# Patient Record
Sex: Female | Born: 1990 | Hispanic: No | Smoking: Current some day smoker
Health system: Southern US, Community
[De-identification: ages and names within clinical notes are randomized; demographics above are authoritative.]

## PROBLEM LIST (undated history)

## (undated) ENCOUNTER — Inpatient Hospital Stay (HOSPITAL_COMMUNITY): Payer: No Typology Code available for payment source

## (undated) DIAGNOSIS — D219 Benign neoplasm of connective and other soft tissue, unspecified: Secondary | ICD-10-CM

## (undated) DIAGNOSIS — A4902 Methicillin resistant Staphylococcus aureus infection, unspecified site: Secondary | ICD-10-CM

## (undated) DIAGNOSIS — F909 Attention-deficit hyperactivity disorder, unspecified type: Secondary | ICD-10-CM

## (undated) DIAGNOSIS — E282 Polycystic ovarian syndrome: Secondary | ICD-10-CM

## (undated) DIAGNOSIS — N390 Urinary tract infection, site not specified: Secondary | ICD-10-CM

## (undated) DIAGNOSIS — R51 Headache: Secondary | ICD-10-CM

## (undated) DIAGNOSIS — IMO0002 Reserved for concepts with insufficient information to code with codable children: Secondary | ICD-10-CM

## (undated) HISTORY — PX: WISDOM TOOTH EXTRACTION: SHX21

## (undated) HISTORY — PX: MYRINGOTOMY: SUR874

---
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
## 2012-01-12 ENCOUNTER — Inpatient Hospital Stay (HOSPITAL_COMMUNITY)
Admission: AD | Admit: 2012-01-12 | Discharge: 2012-01-12 | Disposition: A | Discharge status: Home / Self Care | Payer: Self-pay | Source: Ambulatory Visit | Attending: Obstetrics and Gynecology | Admitting: Obstetrics and Gynecology

## 2012-01-12 ENCOUNTER — Encounter (HOSPITAL_COMMUNITY): Payer: Self-pay | Admitting: *Deleted

## 2012-01-12 DIAGNOSIS — Z3202 Encounter for pregnancy test, result negative: Secondary | ICD-10-CM | POA: Insufficient documentation

## 2012-01-12 DIAGNOSIS — R109 Unspecified abdominal pain: Secondary | ICD-10-CM | POA: Insufficient documentation

## 2012-01-12 LAB — POCT PREGNANCY, URINE: Preg Test, Ur: NEGATIVE

## 2012-01-12 LAB — URINALYSIS, ROUTINE W REFLEX MICROSCOPIC
Leukocytes, UA: NEGATIVE
Nitrite: NEGATIVE
Specific Gravity, Urine: 1.02 (ref 1.005–1.030)
pH: 6.5 (ref 5.0–8.0)

## 2012-01-12 LAB — URINE MICROSCOPIC-ADD ON

## 2012-01-12 NOTE — MAU Note (Signed)
Pt reports she has been having headache and abd cramping and nausea in the morning for the past 2 weeks.

## 2012-01-12 NOTE — MAU Provider Note (Signed)
History     CSN: [id number]  Arrival date and time: 01/12/12 1650   First Provider Initiated Contact with Patient 01/12/12 1923      Chief Complaint  Patient presents with  . Possible Pregnancy  . Abdominal Pain   HPI Monica Ramos 21 y.o.  LMP 12-29-11.  Comes to MAU to have a pregnancy test.  Was not able to get birth control pills filled as she did not have the money.  Medicaid has expired due to her age and she has no other insurance.  Was not feeling well this morning and was worried she was pregnant.  OB History    Grav Para Term Preterm Abortions TAB SAB Ect Mult Living   0               Past Medical History  Diagnosis Date  . Asthma   . ADHD (attention deficit hyperactivity disorder)   . Headache   . Ulcer   . Urinary tract infection     Past Surgical History  Procedure Date  . Wisdom tooth extraction   . Myringotomy     Family History  Problem Relation Age of Onset  . Anesthesia problems Mother   . Asthma Father   . Heart disease Father   . Diabetes Father   . Hearing loss Father   . Heart disease Paternal Uncle   . Cancer Maternal Grandmother   . Heart disease Maternal Grandfather   . Diabetes Maternal Grandfather   . Asthma Brother     History  Substance Use Topics  . Smoking status: Never Smoker   . Smokeless tobacco: Never Used  . Alcohol Use: No    Allergies: No Known Allergies  Prescriptions prior to admission  Medication Sig Dispense Refill  . ACETAMINOPHEN-CAFFEINE PO Take 1 tablet by mouth every 6 (six) hours as needed. For migraine      . tranexamic acid (LYSTEDA) 650 MG TABS Take 1,300 mg by mouth 3 (three) times daily. Take with menstrual bleeding        Review of Systems  Gastrointestinal: Positive for abdominal pain. Negative for nausea, vomiting, diarrhea and constipation.  Genitourinary:       No vaginal discharge. No vaginal bleeding. No dysuria.  Neurological: Positive for headaches.   Physical Exam   Blood  pressure 134/77, pulse 80, temperature 98.6 F (37 C), temperature source Oral, resp. rate 18, height 5\' 6"  (1.676 m), weight 101.606 kg (224 lb), last menstrual period 12/29/2011.  Physical Exam  Nursing note and vitals reviewed. Constitutional: She is oriented to person, place, and time. She appears well-developed and well-nourished.  HENT:  Head: Normocephalic.  Eyes: EOM are normal.  Neck: Neck supple.  Musculoskeletal: Normal range of motion.  Neurological: She is alert and oriented to person, place, and time.  Skin: Skin is warm and dry.  Psychiatric: She has a normal mood and affect.    MAU Course  Procedures Results for orders placed during the hospital encounter of 01/12/12 (from the past 24 hour(s))  URINALYSIS, ROUTINE W REFLEX MICROSCOPIC     Status: Abnormal   Collection Time   01/12/12  5:19 PM      Component Value Range   Color, Urine YELLOW  YELLOW   APPearance CLEAR  CLEAR   Specific Gravity, Urine 1.020  1.005 - 1.030   pH 6.5  5.0 - 8.0   Glucose, UA NEGATIVE  NEGATIVE mg/dL   Hgb urine dipstick MODERATE (*) NEGATIVE   Bilirubin Urine  NEGATIVE  NEGATIVE   Ketones, ur NEGATIVE  NEGATIVE mg/dL   Protein, ur NEGATIVE  NEGATIVE mg/dL   Urobilinogen, UA 0.2  0.0 - 1.0 mg/dL   Nitrite NEGATIVE  NEGATIVE   Leukocytes, UA NEGATIVE  NEGATIVE  URINE MICROSCOPIC-ADD ON     Status: Abnormal   Collection Time   01/12/12  5:19 PM      Component Value Range   Squamous Epithelial / LPF FEW (*) RARE   WBC, UA 0-2  <3 WBC/hpf   RBC / HPF 3-6  <3 RBC/hpf   Bacteria, UA RARE  RARE  POCT PREGNANCY, URINE     Status: Normal   Collection Time   01/12/12  5:47 PM      Component Value Range   Preg Test, Ur NEGATIVE  NEGATIVE    MDM Discussed options for contraception.  Advised condoms always if she is having sex.  Assessment and Plan  Negative pregnancy test  Plan Condoms always for contraception. [facility] as pills much  less expensive with the visit there since your insurance has expired. Monica Ramos 01/12/2012, 7:28 PM

## 2012-05-17 ENCOUNTER — Encounter (HOSPITAL_COMMUNITY): Payer: Self-pay | Admitting: *Deleted

## 2012-05-17 ENCOUNTER — Emergency Department (HOSPITAL_COMMUNITY)
Admission: EM | Admit: 2012-05-17 | Discharge: 2012-05-17 | Disposition: A | Discharge status: Home / Self Care | Payer: Medicaid Other | Attending: Emergency Medicine | Admitting: Emergency Medicine

## 2012-05-17 DIAGNOSIS — J069 Acute upper respiratory infection, unspecified: Secondary | ICD-10-CM | POA: Insufficient documentation

## 2012-05-17 DIAGNOSIS — B349 Viral infection, unspecified: Secondary | ICD-10-CM

## 2012-05-17 DIAGNOSIS — Z3202 Encounter for pregnancy test, result negative: Secondary | ICD-10-CM | POA: Insufficient documentation

## 2012-05-17 DIAGNOSIS — B9789 Other viral agents as the cause of diseases classified elsewhere: Secondary | ICD-10-CM | POA: Insufficient documentation

## 2012-05-17 DIAGNOSIS — J45909 Unspecified asthma, uncomplicated: Secondary | ICD-10-CM | POA: Insufficient documentation

## 2012-05-17 DIAGNOSIS — Z8659 Personal history of other mental and behavioral disorders: Secondary | ICD-10-CM | POA: Insufficient documentation

## 2012-05-17 DIAGNOSIS — R1013 Epigastric pain: Secondary | ICD-10-CM | POA: Insufficient documentation

## 2012-05-17 DIAGNOSIS — Z87440 Personal history of urinary (tract) infections: Secondary | ICD-10-CM | POA: Insufficient documentation

## 2012-05-17 DIAGNOSIS — Z8719 Personal history of other diseases of the digestive system: Secondary | ICD-10-CM | POA: Insufficient documentation

## 2012-05-17 LAB — RAPID STREP SCREEN (MED CTR MEBANE ONLY): Streptococcus, Group A Screen (Direct): NEGATIVE

## 2012-05-17 MED ORDER — ONDANSETRON HCL 4 MG PO TABS: 4.0000 mg | ORAL_TABLET | Freq: Four times a day (QID) | ORAL | Status: DC

## 2012-05-17 NOTE — ED Notes (Signed)
Pt escorted to discharge window. Pt verbalized understanding discharge instructions. In no acute distress.  

## 2012-05-17 NOTE — ED Notes (Signed)
Pt alert and oriented x4. Respirations even and unlabored, bilateral symmetrical rise and fall of chest. Skin warm and dry. In no acute distress. Denies needs.   

## 2012-05-17 NOTE — ED Notes (Addendum)
Pt reports sore throat and abdominal pain x1 day. Pain 3/10. vomited this am, pt reports bc she didn't eat any food yesterday. Some nausea present.   One grandkid dx with ear infection and URI. Another grandchild had strep throat this weekend

## 2012-05-17 NOTE — ED Provider Notes (Signed)
History    This chart was scribed for non-physician practitioner working with Monica Ramos, *  by Marlin Canary, ED Scribe. This patient was seen in room WTR7/WTR7 and the patient's care was started at 1531.   CSN: [id number]  Arrival date & time 05/17/12  1453   First MD Initiated Contact with Patient 05/17/12 1531      Chief Complaint  Patient presents with  . URI    (Consider location/radiation/quality/duration/timing/severity/associated sxs/prior treatment) Patient is a 22 y.o. female presenting with URI. The history is provided by a parent, medical records and the patient. No language interpreter was used.  URI The primary symptoms include abdominal pain (mild, epigastric, resolved), nausea and vomiting. Primary symptoms do not include fever, fatigue, headaches, cough, wheezing or rash. The current episode started 3 to 5 days ago. This is a new problem.  The illness is not associated with congestion.   Monica Ramos is a 22 y.o. female with a hx of asthma, ADHD, presents to the Emergency Department complaining of gradual, persistent, progressively worsening sore throat onset last night.  Patient states that last night and this morning she had mild abdominal pain associated with nausea and vomiting but that resolved prior to arrival in the emergency department. Patient has been exposed to patients with URI symptoms, gastroenteritis and strep. Associated symptoms include nausea, vomiting.  Nothing makes it better and nothing makes it worse.  Pt denies fever, headache, neck pain, chest pain, cough, congestion, diaphoresis, diarrhea, weakness, and dizziness, syncope..      Past Medical History  Diagnosis Date  . Asthma   . ADHD (attention deficit hyperactivity disorder)   . Headache   . Ulcer   . Urinary tract infection     Past Surgical History  Procedure Date  . Wisdom tooth extraction   . Myringotomy     Family History  Problem Relation Age of Onset  .  Anesthesia problems Mother   . Asthma Father   . Heart disease Father   . Diabetes Father   . Hearing loss Father   . Heart disease Paternal Uncle   . Cancer Maternal Grandmother   . Heart disease Maternal Grandfather   . Diabetes Maternal Grandfather   . Asthma Brother     History  Substance Use Topics  . Smoking status: Never Smoker   . Smokeless tobacco: Never Used  . Alcohol Use: No    OB History    Grav Para Term Preterm Abortions TAB SAB Ect Mult Living   0               Review of Systems  Constitutional: Negative for fever, diaphoresis, appetite change, fatigue and unexpected weight change.  HENT: Negative for congestion, mouth sores and neck stiffness.   Eyes: Negative for visual disturbance.  Respiratory: Negative for cough, chest tightness, shortness of breath and wheezing.   Cardiovascular: Negative for chest pain.  Gastrointestinal: Positive for nausea, vomiting and abdominal pain (mild, epigastric, resolved). Negative for diarrhea and constipation.  Genitourinary: Negative for dysuria, urgency, frequency and hematuria.  Musculoskeletal: Negative for back pain.  Skin: Negative for rash.  Neurological: Negative for syncope, light-headedness and headaches.  Hematological: Does not bruise/bleed easily.  Psychiatric/Behavioral: Negative for sleep disturbance. The patient is not nervous/anxious.   All other systems reviewed and are negative.    Allergies  Review of patient's allergies indicates no known allergies.  Home Medications   Current Outpatient Rx  Name  Route  Sig  Dispense  Refill  . LEVONORGEST-ETH ESTRAD 91-DAY 0.15-0.03 &0.01 MG PO TABS   Oral   Take 1 tablet by mouth daily.         . TRANEXAMIC ACID 650 MG PO TABS   Oral   Take 1,300 mg by mouth 3 (three) times daily. Take with menstrual bleeding         . ONDANSETRON HCL 4 MG PO TABS   Oral   Take 1 tablet (4 mg total) by mouth every 6 (six) hours.   12 tablet   0     BP  118/66  Pulse 85  Temp 98.8 F (37.1 C) (Oral)  Resp 16  SpO2 100%  Physical Exam  Nursing note and vitals reviewed. Constitutional: She is oriented to person, place, and time. She appears well-developed and well-nourished. No distress.  HENT:  Head: Normocephalic and atraumatic.  Right Ear: Tympanic membrane, external ear and ear canal normal.  Left Ear: Tympanic membrane, external ear and ear canal normal. No decreased hearing is noted.  Nose: Nose normal. No mucosal edema or rhinorrhea.  Mouth/Throat: Uvula is midline, oropharynx is clear and moist and mucous membranes are normal. No oropharyngeal exudate.       +2 tonsils  No erythema  No  Exudate   Eyes: Conjunctivae normal and EOM are normal. Pupils are equal, round, and reactive to light. No scleral icterus.  Neck: Normal range of motion. Neck supple.  Cardiovascular: Normal rate, regular rhythm, normal heart sounds and intact distal pulses.  Exam reveals no gallop and no friction rub.   No murmur heard. Pulmonary/Chest: Effort normal and breath sounds normal. No respiratory distress. She has no wheezes. She has no rales. She exhibits no tenderness.  Abdominal: Soft. Bowel sounds are normal. She exhibits no distension and no mass. There is no tenderness. There is no rebound and no guarding.       Abdomin soft and non-tender   Musculoskeletal: Normal range of motion. She exhibits no edema and no tenderness.  Lymphadenopathy:    She has no cervical adenopathy.  Neurological: She is alert and oriented to person, place, and time. She exhibits normal muscle tone. Coordination normal.       Speech is clear and goal oriented Moves extremities without ataxia  Skin: Skin is warm and dry. No rash noted. She is not diaphoretic. No erythema.  Psychiatric: She has a normal mood and affect.    ED Course  Procedures (including critical care time)  DIAGNOSTIC STUDIES: Oxygen Saturation is 100% on room air, Normal by my interpretation.     COORDINATION OF CARE:  1531-Patient / Family / Caregiver informed of clinical course, understand medical decision-making process, and agree with plan.   Labs Reviewed  POCT PREGNANCY, URINE  RAPID STREP SCREEN   No results found.   1. Viral syndrome       MDM  Evelena Peat presents with sore throat, nausea and vomiting since last night with sick contacts.  Pt UPT negative.  Centor Criteria score 0 with negative strep test.  Likely viral syndrome vs viral gastroenteritis though the patient has not yet had any diarrhea. Will treat symptomatically.  As GI symptoms have completely resolved, abdominal exam is benign without tenderness or peritoneal signs and patient is tolerating PO without difficulty I do not believe a further labs and imaging is needed.  I have also discussed reasons to return immediately to the ER.  Patient expresses understanding and agrees with plan.   1. Medications: zofran,  usual home medications  2. Treatment: rest, drink plenty of fluids, take medications as prescibed  3. Follow Up: Please followup with your primary doctor for discussion of your diagnoses and further evaluation after today's visit; if you do not have a primary care doctor use the resource guide provided to find one  I personally performed the services described in this documentation, which was scribed in my presence. The recorded information has been reviewed and is accurate.   Dahlia Client Elif Yonts, PA-C 05/17/12 1740

## 2012-05-18 NOTE — ED Provider Notes (Signed)
Medical screening examination/treatment/procedure(s) were performed by non-physician practitioner and as supervising physician I was immediately available for consultation/collaboration.   Christopher J. Pollina, MD 05/18/12 0035 

## 2012-06-04 ENCOUNTER — Encounter (HOSPITAL_COMMUNITY): Payer: Self-pay | Admitting: *Deleted

## 2012-06-04 ENCOUNTER — Inpatient Hospital Stay (HOSPITAL_COMMUNITY)
Admission: AD | Admit: 2012-06-04 | Discharge: 2012-06-04 | Disposition: A | Discharge status: Home / Self Care | Payer: Medicaid Other | Source: Ambulatory Visit | Attending: Obstetrics and Gynecology | Admitting: Obstetrics and Gynecology

## 2012-06-04 DIAGNOSIS — R109 Unspecified abdominal pain: Secondary | ICD-10-CM | POA: Insufficient documentation

## 2012-06-04 DIAGNOSIS — N946 Dysmenorrhea, unspecified: Secondary | ICD-10-CM | POA: Insufficient documentation

## 2012-06-04 LAB — URINALYSIS, ROUTINE W REFLEX MICROSCOPIC
Bilirubin Urine: NEGATIVE
Nitrite: NEGATIVE
Specific Gravity, Urine: >1.03 — ABNORMAL HIGH (ref 1.005–1.030)
pH: 6 (ref 5.0–8.0)

## 2012-06-04 LAB — URINE MICROSCOPIC-ADD ON

## 2012-06-04 LAB — WET PREP, GENITAL: Trich, Wet Prep: NONE SEEN

## 2012-06-04 MED ORDER — KETOROLAC TROMETHAMINE 60 MG/2ML IM SOLN
60.0000 mg | Freq: Once | INTRAMUSCULAR | Status: DC
  Filled 2012-06-04: qty 2

## 2012-06-04 MED ORDER — HYDROCODONE-ACETAMINOPHEN 5-325 MG PO TABS
1.0000 | ORAL_TABLET | Freq: Once | ORAL | Status: AC
  Administered 2012-06-04: 1 via ORAL
  Filled 2012-06-04: qty 1

## 2012-06-04 MED ORDER — HYDROCODONE-ACETAMINOPHEN 5-325 MG PO TABS: 1.0000 | ORAL_TABLET | ORAL | Status: DC | PRN

## 2012-06-04 NOTE — Discharge Instructions (Signed)
Dysmenorrhea  Menstrual pain is caused by the muscles of the uterus tightening (contracting) during a menstrual period. The muscles of the uterus contract due to the chemicals in the uterine lining.  Primary dysmenorrhea is menstrual cramps that last a couple of days when you start having menstrual periods or soon after. This often begins after a teenager starts having her period. As a woman gets older or has a baby, the cramps will usually lesson or disappear.  Secondary dysmenorrhea begins later in life, lasts longer, and the pain may be stronger than primary dysmenorrhea. The pain may start before the period and last a few days after the period. This type of dysmenorrhea is usually caused by an underlying problem such as:  · The tissue lining the uterus grows outside of the uterus in other areas of the body (endometriosis).  · The endometrial tissue, which normally lines the uterus, is found in or grows into the muscular walls of the uterus (adenomyosis).  · The pelvic blood vessels are engorged with blood just before the menstrual period (pelvic congestive syndrome).  · Overgrowth of cells in the lining of the uterus or cervix (polyps of the uterus or cervix).  · Falling down of the uterus (prolapse) because of loose or stretched ligaments.  · Depression.  · Bladder problems, infection, or inflammation.  · Problems with the intestine, a tumor, or irritable bowel syndrome.  · Cancer of the female organs or bladder.  · A severely tipped uterus.  · A very tight opening or closed cervix.  · Noncancerous tumors of the uterus (fibroids).  · Pelvic inflammatory disease (PID).  · Pelvic scarring (adhesions) from a previous surgery.  · Ovarian cyst.  · An intrauterine device (IUD) used for birth control.  CAUSES   The cause of menstrual pain is often unknown.  SYMPTOMS   · Cramping or throbbing pain in your lower abdomen.  · Sometimes, a woman may also experience headaches.  · Lower back pain.  · Feeling sick to your  stomach (nausea) or vomiting.  · Diarrhea.  · Sweating or dizziness.  DIAGNOSIS   A diagnosis is based on your history, symptoms, physical examination, diagnostic tests, or procedures. Diagnostic tests or procedures may include:  · Blood tests.  · An ultrasound.  · An examination of the lining of the uterus (dilation and curettage, D&C).  · An examination inside your abdomen or pelvis with a scope (laparoscopy).  · X-rays.  · CT Scan.  · MRI.  · An examination inside the bladder with a scope (cystoscopy).  · An examination inside the intestine or stomach with a scope (colonoscopy, gastroscopy).  TREATMENT   Treatment depends on the cause of the dysmenorrhea. Treatment may include:  · Pain medicine prescribed by your caregiver.  · Birth control pills.  · Hormone replacement therapy.  · Nonsteroidal anti-inflammatory drugs (NSAIDs). These may help stop the production of prostaglandins.  · An IUD with progesterone hormone in it.  · Acupuncture.  · Surgery to remove adhesions, endometriosis, ovarian cyst, or fibroids.  · Removal of the uterus (hysterectomy).  · Progesterone shots to stop the menstrual period.  · Cutting the nerves on the sacrum that go to the female organs (presacral neurectomy).  · Electric currant to the sacral nerves (sacral nerve stimulation).  · Antidepressant medicine.  · Psychiatric therapy, counseling, or group therapy.  · Exercise and physical therapy.  · Meditation and yoga therapy.  HOME CARE INSTRUCTIONS   · Only take over-the-counter   or prescription medicines for pain, discomfort, or fever as directed by your caregiver.  · Place a heating pad or hot water bottle on your lower back or abdomen. Do not sleep with the heating pad.  · Use aerobic exercises, walking, swimming, biking, and other exercises to help lessen the cramping.  · Massage to the lower back or abdomen may help.  · Stop smoking.  · Avoid alcohol and caffeine.  · Yoga, meditation, or acupuncture may help.  SEEK MEDICAL CARE IF:    · The pain does not get better with medicine.  · You have pain with sexual intercourse.  SEEK IMMEDIATE MEDICAL CARE IF:   · Your pain increases and is not controlled with medicines.  · You have a fever.  · You develop nausea or vomiting with your period not controlled with medicine.  · You have abnormal vaginal bleeding with your period.  · You pass out.  MAKE SURE YOU:   · Understand these instructions.  · Will watch your condition.  · Will get help right away if you are not doing well or get worse.  Document Released: 04/15/2005 Document Revised: 07/08/2011 Document Reviewed: 08/01/2008  [employer]® Patient Information ©2013 [employer].

## 2012-06-04 NOTE — MAU Note (Signed)
Pt states her vaginal bleeding started on 05-27-12 and has been experiencing bad cramping.  Pt states she is currently taking birth control pills which her period only comes every 3 months (seasonique) and isn't due for her normal period for another 3 weeks.

## 2012-06-04 NOTE — MAU Provider Note (Signed)
History     CSN: [id number]  Arrival date and time: 06/04/12 2050   First Provider Initiated Contact with Patient 06/04/12 2219      Chief Complaint  Patient presents with  . Abdominal Cramping   HPI Monica Ramos is a 22 y.o. female who presents to MAU with abdominal pain. The pain started yesterday. She describes the pain as cramping. She rates the pain as 10/10 yesterday and 8/10 today. The pain is located in the mid lower abdomen. Menses currently. Has had similar pain with period before. Last pap smear = never. Current sex partner x 6 years. No history of STI's. Pills for [facility]. She is a regular patient of Eveline Keto, NP in Dr. Ebony Hail office. The history was provided by the patient.  OB History    Grav Para Term Preterm Abortions TAB SAB Ect Mult Living   0               Past Medical History  Diagnosis Date  . Asthma   . ADHD (attention deficit hyperactivity disorder)   . Headache   . Ulcer   . Urinary tract infection     Past Surgical History  Procedure Date  . Wisdom tooth extraction   . Myringotomy     Family History  Problem Relation Age of Onset  . Anesthesia problems Mother   . Asthma Father   . Heart disease Father   . Diabetes Father   . Hearing loss Father   . Heart disease Paternal Uncle   . Cancer Maternal Grandmother   . Heart disease Maternal Grandfather   . Diabetes Maternal Grandfather   . Asthma Brother     History  Substance Use Topics  . Smoking status: Never Smoker   . Smokeless tobacco: Never Used  . Alcohol Use: No    Allergies: No Known Allergies  Prescriptions prior to admission  Medication Sig Dispense Refill  . Levonorgestrel-Ethinyl Estradiol (SEASONIQUE) 0.15-0.03 &0.01 MG tablet Take 1 tablet by mouth daily.      . ondansetron (ZOFRAN) 4 MG tablet Take 1 tablet (4 mg total) by mouth every 6 (six) hours.  12 tablet  0  . tranexamic acid (LYSTEDA) 650 MG TABS Take 1,300 mg by mouth 3 (three) times daily. Take with  menstrual bleeding        Review of Systems  Constitutional: Negative for fever, chills and weight loss.  HENT: Negative for ear pain, nosebleeds, congestion, sore throat and neck pain.   Eyes: Negative for blurred vision, double vision, photophobia and pain.  Respiratory: Negative for cough, shortness of breath and wheezing.   Cardiovascular: Negative for chest pain, palpitations and leg swelling.  Gastrointestinal: Positive for nausea (this morning with pain) and abdominal pain. Negative for heartburn, vomiting, diarrhea and constipation.  Genitourinary: Negative for dysuria, urgency and frequency.  Musculoskeletal: Negative for myalgias and back pain.  Skin: Negative for itching and rash.  Neurological: Positive for headaches. Negative for dizziness, sensory change, speech change, seizures and weakness.  Endo/Heme/Allergies: Does not bruise/bleed easily.  Psychiatric/Behavioral: Negative for depression and substance abuse. The patient is not nervous/anxious and does not have insomnia.    Blood pressure 129/75, pulse 100, temperature 98.9 F (37.2 C), temperature source Oral, resp. rate 16, height 5\' 6"  (1.676 m), weight 224 lb (101.606 kg), last menstrual period 05/27/2012, SpO2 100.00%.  Physical Exam  Nursing note and vitals reviewed. Constitutional: She is oriented to person, place, and time. No distress.  obese  HENT:  Head: Normocephalic and atraumatic.  Eyes: EOM are normal.  Neck: Neck supple.  Cardiovascular: Normal rate.   Respiratory: Effort normal.  GI: Soft. There is no tenderness. There is no CVA tenderness.       Unable to reproduce the cramping pain that the patient states comes and goes.  Genitourinary:       External genitalia without lesions, no CMT, no adnexal tenderness, uterus without palpable enlargement.  Musculoskeletal: Normal range of motion. She exhibits no edema.  Neurological: She is alert and oriented to person, place, and time.  Skin: Skin is  warm and dry.  Psychiatric: She has a normal mood and affect. Her behavior is normal. Judgment and thought content normal.   Procedures  Results for orders placed during the hospital encounter of 06/04/12 (from the past 24 hour(s))  URINALYSIS, ROUTINE W REFLEX MICROSCOPIC     Status: Abnormal   Collection Time   06/04/12  9:15 PM      Component Value Range   Color, Urine YELLOW  YELLOW   APPearance CLEAR  CLEAR   Specific Gravity, Urine >1.030 (*) 1.005 - 1.030   pH 6.0  5.0 - 8.0   Glucose, UA NEGATIVE  NEGATIVE mg/dL   Hgb urine dipstick MODERATE (*) NEGATIVE   Bilirubin Urine NEGATIVE  NEGATIVE   Ketones, ur NEGATIVE  NEGATIVE mg/dL   Protein, ur NEGATIVE  NEGATIVE mg/dL   Urobilinogen, UA 0.2  0.0 - 1.0 mg/dL   Nitrite NEGATIVE  NEGATIVE   Leukocytes, UA NEGATIVE  NEGATIVE  URINE MICROSCOPIC-ADD ON     Status: Normal   Collection Time   06/04/12  9:15 PM      Component Value Range   Squamous Epithelial / LPF RARE  RARE   WBC, UA 0-2  <3 WBC/hpf   RBC / HPF 3-6  <3 RBC/hpf   Bacteria, UA RARE  RARE   Urine-Other MUCOUS PRESENT    POCT PREGNANCY, URINE     Status: Normal   Collection Time   06/04/12  9:26 PM      Component Value Range   Preg Test, Ur NEGATIVE  NEGATIVE    Blood in urine due to menses.  Assessment: 22 y.o. female with dysmenorrhea  Plan:  Toradol 60 mg IM (patient declined)   Hydrocodone 5/325 mg PO   Follow up in the office with Eveline Keto, NP  Discussed with the patient and all questioned fully answered. She will return if any problems arise.    Medication List     As of 06/04/2012 10:59 PM    START taking these medications         HYDROcodone-acetaminophen 5-325 MG per tablet   Commonly known as: NORCO/VICODIN   Take 1 tablet by mouth every 4 (four) hours as needed for pain.      CONTINUE taking these medications         ondansetron 4 MG tablet   Commonly known as: ZOFRAN   Take 1 tablet (4 mg total) by mouth every 6 (six) hours.       SEASONIQUE 0.15-0.03 &0.01 MG tablet   Generic drug: Levonorgestrel-Ethinyl Estradiol      tranexamic acid 650 MG Tabs   Commonly known as: LYSTEDA          Where to get your medications    These are the prescriptions that you need to pick up.   You may get these medications from any pharmacy.  HYDROcodone-acetaminophen 5-325 MG per tablet            NEESE,HOPE, RN, FNP, [facility] 06/04/2012, 10:22 PM

## 2012-12-02 ENCOUNTER — Emergency Department (HOSPITAL_COMMUNITY)
Admission: EM | Admit: 2012-12-02 | Discharge: 2012-12-02 | Disposition: A | Discharge status: Home / Self Care | Payer: Medicaid Other | Attending: Emergency Medicine | Admitting: Emergency Medicine

## 2012-12-02 ENCOUNTER — Encounter (HOSPITAL_COMMUNITY): Payer: Self-pay | Admitting: Emergency Medicine

## 2012-12-02 DIAGNOSIS — R5381 Other malaise: Secondary | ICD-10-CM | POA: Insufficient documentation

## 2012-12-02 DIAGNOSIS — Z8659 Personal history of other mental and behavioral disorders: Secondary | ICD-10-CM | POA: Insufficient documentation

## 2012-12-02 DIAGNOSIS — N898 Other specified noninflammatory disorders of vagina: Secondary | ICD-10-CM | POA: Insufficient documentation

## 2012-12-02 DIAGNOSIS — Z3202 Encounter for pregnancy test, result negative: Secondary | ICD-10-CM | POA: Insufficient documentation

## 2012-12-02 DIAGNOSIS — J45909 Unspecified asthma, uncomplicated: Secondary | ICD-10-CM | POA: Insufficient documentation

## 2012-12-02 DIAGNOSIS — R42 Dizziness and giddiness: Secondary | ICD-10-CM | POA: Insufficient documentation

## 2012-12-02 DIAGNOSIS — Z872 Personal history of diseases of the skin and subcutaneous tissue: Secondary | ICD-10-CM | POA: Insufficient documentation

## 2012-12-02 DIAGNOSIS — Z87440 Personal history of urinary (tract) infections: Secondary | ICD-10-CM | POA: Insufficient documentation

## 2012-12-02 DIAGNOSIS — G8929 Other chronic pain: Secondary | ICD-10-CM | POA: Insufficient documentation

## 2012-12-02 DIAGNOSIS — R531 Weakness: Secondary | ICD-10-CM

## 2012-12-02 DIAGNOSIS — R51 Headache: Secondary | ICD-10-CM | POA: Insufficient documentation

## 2012-12-02 LAB — CBC WITH DIFFERENTIAL/PLATELET
Basophils Relative: 0 % (ref 0–1)
Eosinophils Absolute: 0.1 10*3/uL (ref 0.0–0.7)
Hemoglobin: 12.7 g/dL (ref 12.0–15.0)
MCH: 31 pg (ref 26.0–34.0)
MCHC: 34.8 g/dL (ref 30.0–36.0)
Monocytes Relative: 7 % (ref 3–12)
Neutrophils Relative %: 57 % (ref 43–77)
Platelets: 280 10*3/uL (ref 150–400)

## 2012-12-02 LAB — POCT PREGNANCY, URINE: Preg Test, Ur: NEGATIVE

## 2012-12-02 LAB — COMPREHENSIVE METABOLIC PANEL
Albumin: 3.8 g/dL (ref 3.5–5.2)
Alkaline Phosphatase: 77 U/L (ref 39–117)
BUN: 10 mg/dL (ref 6–23)
Creatinine, Ser: 0.66 mg/dL (ref 0.50–1.10)
Potassium: 3.9 mEq/L (ref 3.5–5.1)
Total Protein: 7.2 g/dL (ref 6.0–8.3)

## 2012-12-02 LAB — GLUCOSE, CAPILLARY: Glucose-Capillary: 84 mg/dL (ref 70–99)

## 2012-12-02 MED ORDER — IBUPROFEN 600 MG PO TABS: 600.0000 mg | ORAL_TABLET | Freq: Four times a day (QID) | ORAL | Status: DC | PRN

## 2012-12-02 MED ORDER — IBUPROFEN 200 MG PO TABS
600.0000 mg | ORAL_TABLET | Freq: Once | ORAL | Status: AC
  Administered 2012-12-02: 600 mg via ORAL
  Filled 2012-12-02: qty 3

## 2012-12-02 NOTE — ED Provider Notes (Signed)
CSN: [id number]     Arrival date & time 12/02/12  1420 History     First MD Initiated Contact with Patient 12/02/12 1506     Chief Complaint  Patient presents with  . Dizziness  . Weakness   (Consider location/radiation/quality/duration/timing/severity/associated sxs/prior Treatment) HPI Pt state she began felling light headed, especially while standing at work today around 1pm. She c/o of generalized fatigue and is starting to gradually develop posterior HA similar to pt's chronic headaches. No photophobia, focal weakness, neck stiffness, visual changes. No fever, chills, SOB, cough, abd pain, N/V/D. Pt states she is currently on her period. No urinary symptoms. States she has been drinking less water than normal.  Past Medical History  Diagnosis Date  . Asthma   . ADHD (attention deficit hyperactivity disorder)   . Headache(784.0)   . Ulcer   . Urinary tract infection    Past Surgical History  Procedure Laterality Date  . Wisdom tooth extraction    . Myringotomy     Family History  Problem Relation Age of Onset  . Anesthesia problems Mother   . Asthma Father   . Heart disease Father   . Diabetes Father   . Hearing loss Father   . Heart disease Paternal Uncle   . Cancer Maternal Grandmother   . Heart disease Maternal Grandfather   . Diabetes Maternal Grandfather   . Asthma Brother    History  Substance Use Topics  . Smoking status: Never Smoker   . Smokeless tobacco: Never Used  . Alcohol Use: No   OB History   Grav Para Term Preterm Abortions TAB SAB Ect Mult Living   0              Review of Systems  Constitutional: Positive for fatigue. Negative for fever and chills.  HENT: Negative for congestion, sore throat, neck pain and neck stiffness.   Eyes: Negative for photophobia and visual disturbance.  Respiratory: Negative for cough and shortness of breath.   Cardiovascular: Negative for chest pain, palpitations and leg swelling.  Gastrointestinal: Negative for  nausea, vomiting, abdominal pain, diarrhea and constipation.  Genitourinary: Positive for vaginal bleeding. Negative for dysuria, frequency, hematuria, flank pain, vaginal discharge, enuresis and pelvic pain.  Skin: Negative for rash and wound.  Neurological: Positive for dizziness, light-headedness and headaches. Negative for syncope, weakness and numbness.  All other systems reviewed and are negative.    Allergies  Review of patient's allergies indicates no known allergies.  Home Medications   Current Outpatient Rx  Name  Route  Sig  Dispense  Refill  . ibuprofen (ADVIL,MOTRIN) 200 MG tablet   Oral   Take 400 mg by mouth every 8 (eight) hours as needed for pain.         Marland Kitchen ibuprofen (ADVIL,MOTRIN) 600 MG tablet   Oral   Take 1 tablet (600 mg total) by mouth every 6 (six) hours as needed for pain.   30 tablet   0    BP 113/68  Pulse 97  Temp(Src) 97.8 F (36.6 C) (Oral)  Resp 18  SpO2 95%  LMP 11/25/2012 Physical Exam  Nursing note and vitals reviewed. Constitutional: She is oriented to person, place, and time. She appears well-developed and well-nourished. No distress.  HENT:  Head: Normocephalic and atraumatic.  Mouth/Throat: Oropharynx is clear and moist.  Eyes: EOM are normal. Pupils are equal, round, and reactive to light.  Neck: Normal range of motion. Neck supple.  No nuchal rigidity   Cardiovascular:  Normal rate and regular rhythm.   Pulmonary/Chest: Effort normal and breath sounds normal. No respiratory distress. She has no wheezes. She has no rales. She exhibits no tenderness.  Abdominal: Soft. Bowel sounds are normal. She exhibits no distension and no mass. There is no tenderness. There is no rebound and no guarding.  Musculoskeletal: Normal range of motion. She exhibits no edema and no tenderness.  Neurological: She is alert and oriented to person, place, and time.  5/5 motor in all ext, sensation intact  Skin: Skin is warm and dry. No rash noted. No  erythema.  Psychiatric: She has a normal mood and affect. Her behavior is normal.    ED Course   Procedures (including critical care time)  Labs Reviewed  COMPREHENSIVE METABOLIC PANEL - Abnormal; Notable for the following:    Total Bilirubin 0.2 (*)    All other components within normal limits  GLUCOSE, CAPILLARY  CBC WITH DIFFERENTIAL  POCT PREGNANCY, URINE   No results found. 1. Generalized weakness   2. Lightheadedness   3. Headache     Date: 12/02/2012  Rate: 58  Rhythm: normal sinus rhythm  QRS Axis: normal  Intervals: normal  ST/T Wave abnormalities: normal  Conduction Disutrbances:none  Narrative Interpretation:   Old EKG Reviewed: none available   MDM  Pt with improved symptoms in ED. Advised to drink plenty of fluids and sleep 7-8 hour per night.   Loren Racer, MD 12/02/12 [phone]

## 2012-12-02 NOTE — ED Notes (Addendum)
Pt reports feeling dehydrated. Pt also reports dizziness and generalized weakness. Pt is A/O x4, NAD, equal strength in all extremities. Mother is concerned that she may also be pregnant because she is not on birth control. Pt denies N/V/D.

## 2013-04-27 ENCOUNTER — Inpatient Hospital Stay (HOSPITAL_COMMUNITY): Payer: Medicaid Other

## 2013-04-27 ENCOUNTER — Encounter (HOSPITAL_COMMUNITY): Payer: Self-pay | Admitting: *Deleted

## 2013-04-27 ENCOUNTER — Inpatient Hospital Stay (HOSPITAL_COMMUNITY)
Admission: AD | Admit: 2013-04-27 | Discharge: 2013-04-27 | Disposition: A | Discharge status: Home / Self Care | Payer: Medicaid Other | Source: Ambulatory Visit | Attending: Obstetrics and Gynecology | Admitting: Obstetrics and Gynecology

## 2013-04-27 DIAGNOSIS — B373 Candidiasis of vulva and vagina: Secondary | ICD-10-CM | POA: Insufficient documentation

## 2013-04-27 DIAGNOSIS — N938 Other specified abnormal uterine and vaginal bleeding: Secondary | ICD-10-CM | POA: Insufficient documentation

## 2013-04-27 DIAGNOSIS — N946 Dysmenorrhea, unspecified: Secondary | ICD-10-CM | POA: Insufficient documentation

## 2013-04-27 DIAGNOSIS — N926 Irregular menstruation, unspecified: Secondary | ICD-10-CM | POA: Insufficient documentation

## 2013-04-27 DIAGNOSIS — N949 Unspecified condition associated with female genital organs and menstrual cycle: Secondary | ICD-10-CM | POA: Insufficient documentation

## 2013-04-27 DIAGNOSIS — R109 Unspecified abdominal pain: Secondary | ICD-10-CM | POA: Insufficient documentation

## 2013-04-27 DIAGNOSIS — B3731 Acute candidiasis of vulva and vagina: Secondary | ICD-10-CM | POA: Insufficient documentation

## 2013-04-27 LAB — CBC
HCT: 35.5 % — ABNORMAL LOW (ref 36.0–46.0)
MCH: 30.3 pg (ref 26.0–34.0)
MCHC: 34.4 g/dL (ref 30.0–36.0)
MCV: 88.1 fL (ref 78.0–100.0)
RDW: 12 % (ref 11.5–15.5)

## 2013-04-27 LAB — WET PREP, GENITAL
Clue Cells Wet Prep HPF POC: NONE SEEN
Trich, Wet Prep: NONE SEEN

## 2013-04-27 MED ORDER — OXYCODONE-ACETAMINOPHEN 5-325 MG PO TABS: 1.0000 | ORAL_TABLET | ORAL | Status: DC | PRN

## 2013-04-27 MED ORDER — FLUCONAZOLE 150 MG PO TABS
150.0000 mg | ORAL_TABLET | Freq: Once | ORAL | Status: AC
  Administered 2013-04-27: 150 mg via ORAL
  Filled 2013-04-27: qty 1

## 2013-04-27 MED ORDER — OXYCODONE-ACETAMINOPHEN 5-325 MG PO TABS
2.0000 | ORAL_TABLET | Freq: Once | ORAL | Status: AC
  Administered 2013-04-27: 2 via ORAL
  Filled 2013-04-27: qty 2

## 2013-04-27 NOTE — MAU Provider Note (Signed)
History     CSN: [id number]  Arrival date and time: 04/27/13 1609   First Provider Initiated Contact with Patient 04/27/13 1643      Chief Complaint  Patient presents with  . Abdominal Pain   HPI [name] is a 22 y.o. G0P0 who presents to MAU today with complaint of heavy vaginal bleeding and lower abdominal pain. The patient states a history of irregular menses since she discontinued OCPs ~ 1 year ago. She states that she was 2-3 weeks late for her period when it started yesterday afternoon. It has become progressively heavier and more painful today. She has changed her pad 4-5 times today and is wearing 2 pads at a time now. She rates her pain at 7/10 now located in the suprapubic region. She took Ibuprofen this morning and then took Hydrocodone around 1400 today without relief. She states 2 negative HPTs.     OB History   Grav Para Term Preterm Abortions TAB SAB Ect Mult Living   0               Past Medical History  Diagnosis Date  . Asthma   . ADHD (attention deficit hyperactivity disorder)   . Headache(784.0)   . Ulcer   . Urinary tract infection     Past Surgical History  Procedure Laterality Date  . Wisdom tooth extraction    . Myringotomy      Family History  Problem Relation Age of Onset  . Anesthesia problems Mother   . Asthma Father   . Heart disease Father   . Diabetes Father   . Hearing loss Father   . Heart disease Paternal Uncle   . Cancer Maternal Grandmother   . Heart disease Maternal Grandfather   . Diabetes Maternal Grandfather   . Asthma Brother     History  Substance Use Topics  . Smoking status: Never Smoker   . Smokeless tobacco: Never Used  . Alcohol Use: No    Allergies: No Known Allergies  Prescriptions prior to admission  Medication Sig Dispense Refill  . ibuprofen (ADVIL,MOTRIN) 200 MG tablet Take 800-1,000 mg by mouth every 8 (eight) hours as needed for moderate pain.         Review of Systems   Constitutional: Positive for malaise/fatigue. Negative for fever.  Gastrointestinal: Positive for nausea and abdominal pain. Negative for vomiting, diarrhea and constipation.  Genitourinary: Negative for dysuria, urgency and frequency.       + vaginal bleeding  Neurological: Positive for dizziness and weakness. Negative for loss of consciousness.   Physical Exam   Blood pressure 139/62, pulse 86, temperature 99.1 F (37.3 C), temperature source Oral, resp. rate 18, height 5\' 5"  (1.651 m), weight 228 lb (103.42 kg), last menstrual period 04/26/2013.  Physical Exam  Constitutional: She is oriented to person, place, and time. She appears well-developed and well-nourished. No distress.  HENT:  Head: Normocephalic and atraumatic.  Cardiovascular: Normal rate, regular rhythm and normal heart sounds.   Respiratory: Effort normal and breath sounds normal. No respiratory distress.  GI: Soft. Bowel sounds are normal. She exhibits no distension and no mass. There is tenderness (mild tenderness to palpation of the suprapubic region at midline). There is no rebound and no guarding.  Genitourinary: Uterus is tender. Uterus is not enlarged. Cervix exhibits no motion tenderness, no discharge and no friability. Right adnexum displays no mass and no tenderness. Left adnexum displays tenderness. Left adnexum displays no mass. There is bleeding (small amount  of blood noted in the vaginal vault) around the vagina. No vaginal discharge found.  Neurological: She is alert and oriented to person, place, and time.  Skin: Skin is warm and dry. No erythema.  Psychiatric: She has a normal mood and affect.   Results for orders placed during the hospital encounter of 04/27/13 (from the past 24 hour(s))  POCT PREGNANCY, URINE     Status: None   Collection Time    04/27/13  4:56 PM      Result Value Range   Preg Test, Ur NEGATIVE  NEGATIVE  WET PREP, GENITAL     Status: Abnormal   Collection Time    04/27/13  5:14  PM      Result Value Range   Yeast Wet Prep HPF POC FEW (*) NONE SEEN   Trich, Wet Prep NONE SEEN  NONE SEEN   Clue Cells Wet Prep HPF POC NONE SEEN  NONE SEEN   WBC, Wet Prep HPF POC FEW (*) NONE SEEN  CBC     Status: Abnormal   Collection Time    04/27/13  5:21 PM      Result Value Range   WBC 12.7 (*) 4.0 - 10.5 K/uL   RBC 4.03  3.87 - 5.11 MIL/uL   Hemoglobin 12.2  12.0 - 15.0 g/dL   HCT 47.8 (*) 29.5 - 62.1 %   MCV 88.1  78.0 - 100.0 fL   MCH 30.3  26.0 - 34.0 pg   MCHC 34.4  30.0 - 36.0 g/dL   RDW 30.8  65.7 - 84.6 %   Platelets 283  150 - 400 K/uL   US Transvaginal Non-ob  04/27/2013   CLINICAL DATA:  Cramping, bleeding.  EXAM: TRANSABDOMINAL AND TRANSVAGINAL ULTRASOUND OF PELVIS  TECHNIQUE: Both transabdominal and transvaginal ultrasound examinations of the pelvis were performed. Transabdominal technique was performed for global imaging of the pelvis including uterus, ovaries, adnexal regions, and pelvic cul-de-sac. It was necessary to proceed with endovaginal exam following the transabdominal exam to visualize the uterus, endometrium, ovaries and adnexa .  COMPARISON:  None  FINDINGS: Uterus  Measurements: 8.1 x 4.4 x 5.0 cm. No fibroids or other mass visualized.  Endometrium  Thickness: 15 mm in thickness.  No focal abnormality visualized.  Right ovary  Measurements: 3.2 x 1.5 x 2.0 cm. Normal appearance/no adnexal mass.  Left ovary  Measurements: 2.8 x 1.4 x 1.9 cm. Normal appearance/no adnexal mass.  Other findings  Trace free fluid in the pelvis, likely physiologic  IMPRESSION: No acute findings. Endometrium upper limits normal in thickness, homogeneous.   Electronically Signed   By: Charlett Nose M.D.   On: 04/27/2013 18:24   US Pelvis Complete  04/27/2013   CLINICAL DATA:  Cramping, bleeding.  EXAM: TRANSABDOMINAL AND TRANSVAGINAL ULTRASOUND OF PELVIS  TECHNIQUE: Both transabdominal and transvaginal ultrasound examinations of the pelvis were performed. Transabdominal technique  was performed for global imaging of the pelvis including uterus, ovaries, adnexal regions, and pelvic cul-de-sac. It was necessary to proceed with endovaginal exam following the transabdominal exam to visualize the uterus, endometrium, ovaries and adnexa .  COMPARISON:  None  FINDINGS: Uterus  Measurements: 8.1 x 4.4 x 5.0 cm. No fibroids or other mass visualized.  Endometrium  Thickness: 15 mm in thickness.  No focal abnormality visualized.  Right ovary  Measurements: 3.2 x 1.5 x 2.0 cm. Normal appearance/no adnexal mass.  Left ovary  Measurements: 2.8 x 1.4 x 1.9 cm. Normal appearance/no adnexal mass.  Other  findings  Trace free fluid in the pelvis, likely physiologic  IMPRESSION: No acute findings. Endometrium upper limits normal in thickness, homogeneous.   Electronically Signed   By: Charlett Nose M.D.   On: 04/27/2013 18:24    MAU Course  Procedures None  MDM UPT - negative UA, Wet prep, GC/Chlamydia, CBC and [place] today 2 percocet given in MAU. Patient states improvement in pain with rest 150 mg Diflucan given in MAU today for yeast vulvovaginitis Assessment and Plan  A: Irregular menses Dysmenorrhea Yeast vulvovaginitis  P: Discharge home Rx for Percocet given to patient Bleeding precautions discussed Referral to [facility] clinic for further management of irregular bleeding Patient may return to MAU as needed or if her condition were to change or worsen  Freddi Starr, PA-C  04/27/2013, 6:52 PM

## 2013-04-27 NOTE — MAU Note (Addendum)
Hasn't had period, was 2 wks late (though reports irreg cycles).  Did 2 preg tests, both were neg.  Started cramping and bleeding yesterday.  Pains are really bad.

## 2013-06-07 ENCOUNTER — Encounter: Payer: Medicaid Other | Admitting: Family Medicine

## 2013-06-07 ENCOUNTER — Encounter: Payer: Self-pay | Admitting: Family Medicine

## 2014-04-22 ENCOUNTER — Emergency Department (HOSPITAL_COMMUNITY): Payer: Self-pay

## 2014-04-22 ENCOUNTER — Encounter (HOSPITAL_COMMUNITY): Payer: Self-pay | Admitting: Emergency Medicine

## 2014-04-22 ENCOUNTER — Emergency Department (HOSPITAL_COMMUNITY)
Admission: EM | Admit: 2014-04-22 | Discharge: 2014-04-22 | Disposition: A | Discharge status: Home / Self Care | Payer: Self-pay | Attending: Emergency Medicine | Admitting: Emergency Medicine

## 2014-04-22 DIAGNOSIS — S79912A Unspecified injury of left hip, initial encounter: Secondary | ICD-10-CM | POA: Insufficient documentation

## 2014-04-22 DIAGNOSIS — S3991XA Unspecified injury of abdomen, initial encounter: Secondary | ICD-10-CM | POA: Insufficient documentation

## 2014-04-22 DIAGNOSIS — R519 Headache, unspecified: Secondary | ICD-10-CM

## 2014-04-22 DIAGNOSIS — M25552 Pain in left hip: Secondary | ICD-10-CM

## 2014-04-22 DIAGNOSIS — Y998 Other external cause status: Secondary | ICD-10-CM | POA: Insufficient documentation

## 2014-04-22 DIAGNOSIS — Z872 Personal history of diseases of the skin and subcutaneous tissue: Secondary | ICD-10-CM | POA: Insufficient documentation

## 2014-04-22 DIAGNOSIS — S40811A Abrasion of right upper arm, initial encounter: Secondary | ICD-10-CM

## 2014-04-22 DIAGNOSIS — S0990XA Unspecified injury of head, initial encounter: Secondary | ICD-10-CM | POA: Insufficient documentation

## 2014-04-22 DIAGNOSIS — Z87440 Personal history of urinary (tract) infections: Secondary | ICD-10-CM | POA: Insufficient documentation

## 2014-04-22 DIAGNOSIS — R51 Headache: Secondary | ICD-10-CM

## 2014-04-22 DIAGNOSIS — Y9389 Activity, other specified: Secondary | ICD-10-CM | POA: Insufficient documentation

## 2014-04-22 DIAGNOSIS — Z3202 Encounter for pregnancy test, result negative: Secondary | ICD-10-CM | POA: Insufficient documentation

## 2014-04-22 DIAGNOSIS — S299XXA Unspecified injury of thorax, initial encounter: Secondary | ICD-10-CM | POA: Insufficient documentation

## 2014-04-22 DIAGNOSIS — Z8659 Personal history of other mental and behavioral disorders: Secondary | ICD-10-CM | POA: Insufficient documentation

## 2014-04-22 DIAGNOSIS — Y92410 Unspecified street and highway as the place of occurrence of the external cause: Secondary | ICD-10-CM | POA: Insufficient documentation

## 2014-04-22 DIAGNOSIS — S50811A Abrasion of right forearm, initial encounter: Secondary | ICD-10-CM | POA: Insufficient documentation

## 2014-04-22 DIAGNOSIS — J45909 Unspecified asthma, uncomplicated: Secondary | ICD-10-CM | POA: Insufficient documentation

## 2014-04-22 LAB — POC URINE PREG, ED: Preg Test, Ur: NEGATIVE

## 2014-04-22 MED ORDER — ONDANSETRON 4 MG PO TBDP
4.0000 mg | ORAL_TABLET | Freq: Once | ORAL | Status: AC
  Administered 2014-04-22: 4 mg via ORAL
  Filled 2014-04-22: qty 1

## 2014-04-22 MED ORDER — IBUPROFEN 600 MG PO TABS
600.0000 mg | ORAL_TABLET | Freq: Four times a day (QID) | ORAL | Status: DC | PRN
Start: 2014-04-22 — End: 2014-05-30

## 2014-04-22 MED ORDER — CYCLOBENZAPRINE HCL 10 MG PO TABS: 10.0000 mg | ORAL_TABLET | Freq: Two times a day (BID) | ORAL | Status: DC | PRN

## 2014-04-22 MED ORDER — TRAMADOL HCL 50 MG PO TABS: 50.0000 mg | ORAL_TABLET | Freq: Four times a day (QID) | ORAL | Status: DC | PRN

## 2014-04-22 MED ORDER — HYDROCODONE-ACETAMINOPHEN 5-325 MG PO TABS
1.0000 | ORAL_TABLET | Freq: Once | ORAL | Status: AC
  Administered 2014-04-22: 1 via ORAL
  Filled 2014-04-22: qty 1
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
## 2014-05-30 ENCOUNTER — Inpatient Hospital Stay (HOSPITAL_COMMUNITY)
Admission: AD | Admit: 2014-05-30 | Discharge: 2014-05-30 | Disposition: A | Discharge status: Home / Self Care | Payer: Medicaid Other | Source: Ambulatory Visit | Attending: Obstetrics and Gynecology | Admitting: Obstetrics and Gynecology

## 2014-05-30 ENCOUNTER — Emergency Department (HOSPITAL_COMMUNITY)
Admission: EM | Admit: 2014-05-30 | Discharge: 2014-05-30 | Disposition: A | Discharge status: Home / Self Care | Payer: No Typology Code available for payment source | Attending: Emergency Medicine | Admitting: Emergency Medicine

## 2014-05-30 ENCOUNTER — Encounter (HOSPITAL_COMMUNITY): Payer: Self-pay | Admitting: Emergency Medicine

## 2014-05-30 DIAGNOSIS — K088 Other specified disorders of teeth and supporting structures: Secondary | ICD-10-CM | POA: Insufficient documentation

## 2014-05-30 DIAGNOSIS — N926 Irregular menstruation, unspecified: Secondary | ICD-10-CM | POA: Insufficient documentation

## 2014-05-30 DIAGNOSIS — Z87440 Personal history of urinary (tract) infections: Secondary | ICD-10-CM | POA: Insufficient documentation

## 2014-05-30 DIAGNOSIS — K0889 Other specified disorders of teeth and supporting structures: Secondary | ICD-10-CM

## 2014-05-30 DIAGNOSIS — Z3202 Encounter for pregnancy test, result negative: Secondary | ICD-10-CM | POA: Insufficient documentation

## 2014-05-30 DIAGNOSIS — N911 Secondary amenorrhea: Secondary | ICD-10-CM | POA: Insufficient documentation

## 2014-05-30 DIAGNOSIS — K029 Dental caries, unspecified: Secondary | ICD-10-CM | POA: Insufficient documentation

## 2014-05-30 DIAGNOSIS — Z8659 Personal history of other mental and behavioral disorders: Secondary | ICD-10-CM | POA: Insufficient documentation

## 2014-05-30 DIAGNOSIS — J45909 Unspecified asthma, uncomplicated: Secondary | ICD-10-CM | POA: Insufficient documentation

## 2014-05-30 LAB — POCT PREGNANCY, URINE: PREG TEST UR: NEGATIVE

## 2014-05-30 MED ORDER — OXYCODONE-ACETAMINOPHEN 5-325 MG PO TABS
1.0000 | ORAL_TABLET | Freq: Once | ORAL | Status: AC
  Administered 2014-05-30: 1 via ORAL
  Filled 2014-05-30: qty 1

## 2014-05-30 MED ORDER — HYDROCODONE-ACETAMINOPHEN 5-325 MG PO TABS: 1.0000 | ORAL_TABLET | Freq: Four times a day (QID) | ORAL | Status: DC | PRN

## 2014-05-30 MED ORDER — IBUPROFEN 800 MG PO TABS: 800.0000 mg | ORAL_TABLET | Freq: Three times a day (TID) | ORAL | Status: DC
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
## 2014-05-30 NOTE — MAU Note (Signed)
Pt presents complaining that her period is 3 weeks late. Pt having nausea. Denies vaginal bleeding or discharge. Denies pain.

## 2014-05-30 NOTE — MAU Provider Note (Signed)
CC: Possible Pregnancy    Subjective HPI Monica Ramos 24 y.o. G0P0 presents requesting pregnancy test since she has had delayed menses. LMP 04/13/2014. HPT not done  Menses had been regular at 4 week intervals with normal light-moderate flow until December when it was a few weeks late and also preceded by spotting.  Sexually active without contraception. Does not desire pregnancy. Denies vaginal bleeding, abdominal pain, abnormal vaginal discharge or new sex partner No other concerns.   Significant PMH: Obesity Significant OB history: None GYN provider is Joycelyn Rua NP Nonsmoker  Objective Blood pressure 147/86, pulse 68, temperature 98.3 F (36.8 C), temperature source Oral, resp. rate 16, height 5\' 6"  (1.676 m), weight 108.863 kg (240 lb), last menstrual period 03/29/2014.  Physical Exam General: WN, WD female in no apparent distress Abdomen: soft, nontender, without organomegaly  Lab Results Results for orders placed or performed during the hospital encounter of 05/30/14 (from the past 24 hour(s))  Pregnancy, urine POC     Status: None   Collection Time: 05/30/14  4:13 PM  Result Value Ref Range   Preg Test, Ur NEGATIVE NEGATIVE   MAU Course: Discussed possible causes for secondary amenorrhea or long cycles. Also reviewed LARC options.  Assessment 24 y.o. G0P0  1. Irregular periods/menstrual cycles     Plan Keep bleeding and menstruation calendar and bring to visit     Medication List    STOP taking these medications        HYDROcodone-acetaminophen 5-325 MG per tablet  Commonly known as:  NORCO/VICODIN      TAKE these medications        ibuprofen 800 MG tablet  Commonly known as:  ADVIL,MOTRIN  Take 1 tablet (800 mg total) by mouth 3 (three) times daily.         Follow-up Information    Follow up with Elesa Massed., NP. Schedule an appointment as soon as possible for a visit in 1 week.   Specialty:  Obstetrics and Gynecology   Contact  information:   [address] [place] [address] [phone]       Lorene Dy, CNM 05/30/2014 4:34 PM

## 2014-05-30 NOTE — ED Provider Notes (Signed)
CSN: [id number]     Arrival date & time 05/30/14  1340 History   This chart was scribed for Harvie Heck, PA-C, working with Veryl Speak, MD by Marti Sleigh, ED Scribe. This patient was seen in room WTR6/WTR6 and the patient's care was started at 2:27 PM.   Chief Complaint  Patient presents with  . Dental Pain    HPI Comments: Monica Ramos is a 24 y.o. female who presents to the Emergency Department complaining of upper left-upper tooth pain for the last week. Pt states that she has had pain in the same tooth in the past. Pt has had multiple dental extractions in the past, but has not seen a dentist in the last couple of years.  The history is provided by the patient. No language interpreter was used.    Past Medical History  Diagnosis Date  . Asthma   . ADHD (attention deficit hyperactivity disorder)   . Headache(784.0)   . Ulcer   . Urinary tract infection    Past Surgical History  Procedure Laterality Date  . Wisdom tooth extraction    . Myringotomy     Family History  Problem Relation Age of Onset  . Anesthesia problems Mother   . Asthma Father   . Heart disease Father   . Diabetes Father   . Hearing loss Father   . Heart disease Paternal Uncle   . Cancer Maternal Grandmother   . Heart disease Maternal Grandfather   . Diabetes Maternal Grandfather   . Asthma Brother    History  Substance Use Topics  . Smoking status: Never Smoker   . Smokeless tobacco: Never Used  . Alcohol Use: No   OB History    Gravida Para Term Preterm AB TAB SAB Ectopic Multiple Living   0              Review of Systems  Constitutional: Negative for fever and chills.  HENT: Positive for dental problem. Negative for facial swelling.     Allergies  Review of patient's allergies indicates no known allergies.  Home Medications   Prior to Admission medications   Medication Sig Start Date End Date Taking? Authorizing Provider  ibuprofen (ADVIL,MOTRIN) 600 MG tablet Take 1 tablet (600  mg total) by mouth every 6 (six) hours as needed. 04/22/14  Yes Noland Fordyce, PA-C  cyclobenzaprine (FLEXERIL) 10 MG tablet Take 1 tablet (10 mg total) by mouth 2 (two) times daily as needed for muscle spasms. Patient not taking: Reported on 05/30/2014 04/22/14   Noland Fordyce, PA-C  oxyCODONE-acetaminophen (PERCOCET/ROXICET) 5-325 MG per tablet Take 1-2 tablets by mouth every 4 (four) hours as needed for severe pain. Patient not taking: Reported on 04/22/2014 04/27/13   Luvenia Redden, PA-C  traMADol (ULTRAM) 50 MG tablet Take 1 tablet (50 mg total) by mouth every 6 (six) hours as needed. Patient not taking: Reported on 05/30/2014 04/22/14   Noland Fordyce, PA-C   BP 142/73 mmHg  Pulse 92  Temp(Src) 97.7 F (36.5 C) (Oral)  Resp 16  SpO2 100%  LMP 04/11/2014 Physical Exam  Constitutional: She is oriented to person, place, and time. She appears well-developed and well-nourished. No distress.  HENT:  Head: Normocephalic and atraumatic.  Mouth/Throat: Oropharynx is clear and moist and mucous membranes are normal. No trismus in the jaw. Dental caries present.  Large cavity to tooth #14 and #2. Tenderness to palpation of tooth # 14. No signs of peritonsillar or tonsillar abscess. No signs of gingival abscess.  Oropharynx is clear and without exudates. Soft non-tender sublingual mucosa, no tongue elevation, no edema to sublingual space, normal voice. Airway patent.  Eyes: Pupils are equal, round, and reactive to light.  Neck: Neck supple.  Pulmonary/Chest: Effort normal. No respiratory distress.  Neurological: She is alert and oriented to person, place, and time.  Skin: Skin is warm and dry.  Psychiatric: She has a normal mood and affect. Her behavior is normal.  Nursing note and vitals reviewed.   ED Course  Procedures  DIAGNOSTIC STUDIES: Oxygen Saturation is 100% on RA, normal by my interpretation.    COORDINATION OF CARE: 2:32 PM Discussed treatment plan with pt at bedside and pt agreed  to plan.  Labs Review Labs Reviewed - No data to display  Imaging Review No results found.   EKG Interpretation None      MDM   Final diagnoses:  Pain, dental   Patient with toothache.  No gross abscess.  Exam unconcerning for Ludwig's angina or spread of infection.  Will treat with pain medicine.  Urged patient to follow-up with dentist.   Meds given in ED:  Medications  oxyCODONE-acetaminophen (PERCOCET/ROXICET) 5-325 MG per tablet 1 tablet (1 tablet Oral Given 05/30/14 1444)    Discharge Medication List as of 05/30/2014  3:05 PM    START taking these medications   Details  HYDROcodone-acetaminophen (NORCO/VICODIN) 5-325 MG per tablet Take 1 tablet by mouth every 6 (six) hours as needed for moderate pain or severe pain., Starting 05/30/2014, Until Discontinued, Print       I personally performed the services described in this documentation, which was scribed in my presence. The recorded information has been reviewed and is accurate.     Harvie Heck, PA-C 05/30/14 1721  Veryl Speak, MD 05/31/14 [phone]

## 2014-05-30 NOTE — Discharge Instructions (Signed)
Keep menstrual and bleeding calendar and bring to your visit.Contraception Choices Contraception (birth control) is the use of any methods or devices to prevent pregnancy. Below are some methods to help avoid pregnancy. HORMONAL METHODS   Contraceptive implant. This is a thin, plastic tube containing progesterone hormone. It does not contain estrogen hormone. Your health care provider inserts the tube in the inner part of the upper arm. The tube can remain in place for up to 3 years. After 3 years, the implant must be removed. The implant prevents the ovaries from releasing an egg (ovulation), thickens the cervical mucus to prevent sperm from entering the uterus, and thins the lining of the inside of the uterus.  Progesterone-only injections. These injections are given every 3 months by your health care provider to prevent pregnancy. This synthetic progesterone hormone stops the ovaries from releasing eggs. It also thickens cervical mucus and changes the uterine lining. This makes it harder for sperm to survive in the uterus.  Birth control pills. These pills contain estrogen and progesterone hormone. They work by preventing the ovaries from releasing eggs (ovulation). They also cause the cervical mucus to thicken, preventing the sperm from entering the uterus. Birth control pills are prescribed by a health care provider.Birth control pills can also be used to treat heavy periods.  Minipill. This type of birth control pill contains only the progesterone hormone. They are taken every day of each month and must be prescribed by your health care provider.  Birth control patch. The patch contains hormones similar to those in birth control pills. It must be changed once a week and is prescribed by a health care provider.  Vaginal ring. The ring contains hormones similar to those in birth control pills. It is left in the vagina for 3 weeks, removed for 1 week, and then a new one is put back in place. The  patient must be comfortable inserting and removing the ring from the vagina.A health care provider's prescription is necessary.  Emergency contraception. Emergency contraceptives prevent pregnancy after unprotected sexual intercourse. This pill can be taken right after sex or up to 5 days after unprotected sex. It is most effective the sooner you take the pills after having sexual intercourse. Most emergency contraceptive pills are available without a prescription. Check with your pharmacist. Do not use emergency contraception as your only form of birth control. BARRIER METHODS   Female condom. This is a thin sheath (latex or rubber) that is worn over the penis during sexual intercourse. It can be used with spermicide to increase effectiveness.  Female condom. This is a soft, loose-fitting sheath that is put into the vagina before sexual intercourse.  Diaphragm. This is a soft, latex, dome-shaped barrier that must be fitted by a health care provider. It is inserted into the vagina, along with a spermicidal jelly. It is inserted before intercourse. The diaphragm should be left in the vagina for 6 to 8 hours after intercourse.  Cervical cap. This is a round, soft, latex or plastic cup that fits over the cervix and must be fitted by a health care provider. The cap can be left in place for up to 48 hours after intercourse.  Sponge. This is a soft, circular piece of polyurethane foam. The sponge has spermicide in it. It is inserted into the vagina after wetting it and before sexual intercourse.  Spermicides. These are chemicals that kill or block sperm from entering the cervix and uterus. They come in the form of creams, jellies,  suppositories, foam, or tablets. They do not require a prescription. They are inserted into the vagina with an applicator before having sexual intercourse. The process must be repeated every time you have sexual intercourse. INTRAUTERINE CONTRACEPTION  Intrauterine device (IUD).  This is a T-shaped device that is put in a woman's uterus during a menstrual period to prevent pregnancy. There are 2 types:  Copper IUD. This type of IUD is wrapped in copper wire and is placed inside the uterus. Copper makes the uterus and fallopian tubes produce a fluid that kills sperm. It can stay in place for 10 years.  Hormone IUD. This type of IUD contains the hormone progestin (synthetic progesterone). The hormone thickens the cervical mucus and prevents sperm from entering the uterus, and it also thins the uterine lining to prevent implantation of a fertilized egg. The hormone can weaken or kill the sperm that get into the uterus. It can stay in place for 3-5 years, depending on which type of IUD is used. PERMANENT METHODS OF CONTRACEPTION  Female tubal ligation. This is when the woman's fallopian tubes are surgically sealed, tied, or blocked to prevent the egg from traveling to the uterus.  Hysteroscopic sterilization. This involves placing a small coil or insert into each fallopian tube. Your doctor uses a technique called hysteroscopy to do the procedure. The device causes scar tissue to form. This results in permanent blockage of the fallopian tubes, so the sperm cannot fertilize the egg. It takes about 3 months after the procedure for the tubes to become blocked. You must use another form of birth control for these 3 months.  Female sterilization. This is when the female has the tubes that carry sperm tied off (vasectomy).This blocks sperm from entering the vagina during sexual intercourse. After the procedure, the man can still ejaculate fluid (semen). NATURAL PLANNING METHODS  Natural family planning. This is not having sexual intercourse or using a barrier method (condom, diaphragm, cervical cap) on days the woman could become pregnant.  Calendar method. This is keeping track of the length of each menstrual cycle and identifying when you are fertile.  Ovulation method. This is  avoiding sexual intercourse during ovulation.  Symptothermal method. This is avoiding sexual intercourse during ovulation, using a thermometer and ovulation symptoms.  Post-ovulation method. This is timing sexual intercourse after you have ovulated. Regardless of which type or method of contraception you choose, it is important that you use condoms to protect against the transmission of sexually transmitted infections (STIs). Talk with your health care provider about which form of contraception is most appropriate for you. Document Released: 04/15/2005 Document Revised: 04/20/2013 Document Reviewed: 10/08/2012 [facility] Patient Information 2015 [place], [place]. This information is not intended to replace advice given to you by your health care provider. Make sure you discuss any questions you have with your health care provider.

## 2014-05-30 NOTE — Discharge Instructions (Signed)
You have a dental injury. Use the resource guide listed below to help you find a dentist if you do not already have one to followup with. It is very important that you get evaluated by a dentist as soon as possible. Call tomorrow to schedule an appointment. Use your pain medication as prescribed and do not operate heavy machinery while on pain medication. Note that your pain medication contains acetaminophen (Tylenol) & its is not reccommended that you use additional acetaminophen (Tylenol) while taking this medication. Read the instructions below.  Eat a soft or liquid diet and rinse your mouth out after meals with warm water. You should see a dentist or return here at once if you have increased swelling, increased pain or uncontrolled bleeding from the site of your injury.   SEEK MEDICAL CARE IF:   You have increased pain not controlled with medicines.   You have swelling around your tooth, in your face or neck.   You have bleeding which starts, continues, or gets worse.   You have a fever >101  If you are unable to open your mouth  RESOURCE GUIDE  Dental Problems  Patients with Medicaid: [place] [address] Phone:  [phone]                                                  Phone:  [phone]  If unable to pay or uninsured, contact:  [facility] or [facility]. to become qualified for the adult dental clinic.  Chronic Pain Problems Contact [facility]  [phone] Patients need to be referred by their primary care doctor.  Insufficient Money for Medicine Contact [employer]:  call "211" or [place] [phone].  No Primary Care Doctor Call [facility]  [phone] Other agencies that provide inexpensive medical care    [place]  [phone]    [facility]  [place]  [phone]    [facility]  [phone]    [facility]  [place]  [place]  [phone] [place]   [phone] (emergency services [phone])  Substance Abuse Resources [facility]  [phone] [facility] [phone] [facility] [place] [phone] [place] P[facility] [phone] Residential & Outpatient Substance Abuse Program  [phone]  Abuse/Neglect [place] [phone] [place] [phone] (After Hours)  Emergency [place] [phone]  [facility] [phone] [place] [phone]  MRSA Hotline #:   862-690-9936    [facility]  [facility] Dept. [address]      [place] Phone:  [phone]                                   Phone:  [phone]                 Phone:  [place] Phone:  [place] [phone] (After Hours)

## 2014-06-23 ENCOUNTER — Encounter (HOSPITAL_COMMUNITY): Payer: Self-pay | Admitting: *Deleted

## 2014-06-23 ENCOUNTER — Inpatient Hospital Stay (HOSPITAL_COMMUNITY)
Admission: AD | Admit: 2014-06-23 | Discharge: 2014-06-23 | Disposition: A | Discharge status: Home / Self Care | Payer: Self-pay | Source: Ambulatory Visit | Attending: Obstetrics & Gynecology | Admitting: Obstetrics & Gynecology

## 2014-06-23 DIAGNOSIS — N926 Irregular menstruation, unspecified: Secondary | ICD-10-CM | POA: Insufficient documentation

## 2014-06-23 DIAGNOSIS — Z3202 Encounter for pregnancy test, result negative: Secondary | ICD-10-CM | POA: Insufficient documentation

## 2014-06-23 DIAGNOSIS — N6452 Nipple discharge: Secondary | ICD-10-CM | POA: Insufficient documentation

## 2014-06-23 LAB — URINALYSIS, ROUTINE W REFLEX MICROSCOPIC
Bilirubin Urine: NEGATIVE
Glucose, UA: NEGATIVE mg/dL
KETONES UR: NEGATIVE mg/dL
LEUKOCYTES UA: NEGATIVE
Nitrite: NEGATIVE
PROTEIN: NEGATIVE mg/dL
Specific Gravity, Urine: <1.005 — ABNORMAL LOW (ref 1.005–1.030)
UROBILINOGEN UA: 0.2 mg/dL (ref 0.0–1.0)
pH: 5.5 (ref 5.0–8.0)

## 2014-06-23 LAB — URINE MICROSCOPIC-ADD ON

## 2014-06-23 LAB — POCT PREGNANCY, URINE: Preg Test, Ur: NEGATIVE

## 2014-06-23 NOTE — MAU Provider Note (Signed)
History     CSN: [id number]  Arrival date and time: 06/23/14 5277   First Provider Initiated Contact with Patient 06/23/14 2005      No chief complaint on file.  HPI 24 y.o. G0P0 w/ late menses, cramping and spotting and bilateral, nonspontaneous, clear nipple discharge. Mild breast tenderness, no pain or masses. Trying to get pregnant x about 7 months. Concerned she might be pregnant today. Patient's last menstrual period was 05/11/2013. Some h/o irregular menses.   Past Medical History  Diagnosis Date  . Asthma   . ADHD (attention deficit hyperactivity disorder)   . Headache(784.0)   . Ulcer   . Urinary tract infection     Past Surgical History  Procedure Laterality Date  . Wisdom tooth extraction    . Myringotomy      Family History  Problem Relation Age of Onset  . Anesthesia problems Mother   . Asthma Father   . Heart disease Father   . Diabetes Father   . Hearing loss Father   . Heart disease Paternal Uncle   . Cancer Maternal Grandmother   . Heart disease Maternal Grandfather   . Diabetes Maternal Grandfather   . Asthma Brother     History  Substance Use Topics  . Smoking status: Never Smoker   . Smokeless tobacco: Never Used  . Alcohol Use: No    Allergies: No Known Allergies  Prescriptions prior to admission  Medication Sig Dispense Refill Last Dose  . amoxicillin (AMOXIL) 500 MG capsule Take 500 mg by mouth 2 (two) times daily.   06/23/2014 at Unknown time  . ibuprofen (ADVIL,MOTRIN) 800 MG tablet Take 1 tablet (800 mg total) by mouth 3 (three) times daily. 21 tablet 0 06/23/2014 at Unknown time    Review of Systems  Constitutional: Negative.   Respiratory: Negative.   Cardiovascular: Negative.   Gastrointestinal: Negative for nausea, vomiting, abdominal pain, diarrhea and constipation.  Genitourinary: Negative for dysuria, urgency, frequency, hematuria and flank pain.       + cramping and spotting  Musculoskeletal: Negative.   Neurological:  Negative.   Psychiatric/Behavioral: Negative.    Physical Exam   Blood pressure 135/82, pulse 73, temperature 98.7 F (37.1 C), temperature source Oral, resp. rate 18, height 5\' 6"  (1.676 m), weight 241 lb (109.317 kg), last menstrual period 05/11/2013, SpO2 100 %, unknown if currently breastfeeding.  Physical Exam  Nursing note and vitals reviewed. Constitutional: She is oriented to person, place, and time. She appears well-developed. No distress.  obese  Cardiovascular: Normal rate.   Respiratory: Effort normal.  Musculoskeletal: Normal range of motion.  Neurological: She is alert and oriented to person, place, and time.  Skin: Skin is warm and dry.  Psychiatric: She has a normal mood and affect.    MAU Course  Procedures  Results for orders placed or performed during the hospital encounter of 06/23/14 (from the past 24 hour(s))  Urinalysis, Routine w reflex microscopic     Status: Abnormal   Collection Time: 06/23/14  7:43 PM  Result Value Ref Range   Color, Urine YELLOW YELLOW   APPearance CLEAR CLEAR   Specific Gravity, Urine <1.005 (L) 1.005 - 1.030   pH 5.5 5.0 - 8.0   Glucose, UA NEGATIVE NEGATIVE mg/dL   Hgb urine dipstick TRACE (A) NEGATIVE   Bilirubin Urine NEGATIVE NEGATIVE   Ketones, ur NEGATIVE NEGATIVE mg/dL   Protein, ur NEGATIVE NEGATIVE mg/dL   Urobilinogen, UA 0.2 0.0 - 1.0 mg/dL   Nitrite  NEGATIVE NEGATIVE   Leukocytes, UA NEGATIVE NEGATIVE  Urine microscopic-add on     Status: None   Collection Time: 06/23/14  7:43 PM  Result Value Ref Range   Squamous Epithelial / LPF RARE RARE   WBC, UA 0-2 <3 WBC/hpf   RBC / HPF 0-2 <3 RBC/hpf   Bacteria, UA RARE RARE  Pregnancy, urine POC     Status: None   Collection Time: 06/23/14  7:49 PM  Result Value Ref Range   Preg Test, Ur NEGATIVE NEGATIVE     Assessment and Plan   1. Pregnancy examination or test, negative result   2. Nipple discharge in female   Pt primarily concerned w/ pregnancy test, upon  negative test, declined further exam. Discussed at length possible causes for irregular menses and nipple discharge, can be evaluated outpatient if needed. Pt sees NP in private GYN office. Advised no breast stimulation, observe for spontaneous d/c or breast changes. Offered breast exam today, pt declines.     Medication List    TAKE these medications        amoxicillin 500 MG capsule  Commonly known as:  AMOXIL  Take 500 mg by mouth 2 (two) times daily.     ibuprofen 800 MG tablet  Commonly known as:  ADVIL,MOTRIN  Take 1 tablet (800 mg total) by mouth 3 (three) times daily.            Follow-up Information    Follow up with Elesa Massed., NP.   Specialty:  Obstetrics and Gynecology   Why:  As needed   Contact information:   [address] [place] [address] [phone]         Braydn Carneiro 06/23/2014, 8:09 PM

## 2014-06-23 NOTE — MAU Note (Signed)
Pt reports she has been having breast tenderness and some lower abd cramping. States she also has been having some fluid leak from her breast since last pm.

## 2014-07-13 ENCOUNTER — Emergency Department (HOSPITAL_COMMUNITY): Payer: Medicaid Other

## 2014-07-13 ENCOUNTER — Emergency Department (HOSPITAL_COMMUNITY)
Admission: EM | Admit: 2014-07-13 | Discharge: 2014-07-13 | Disposition: A | Discharge status: Home / Self Care | Payer: Self-pay | Attending: Emergency Medicine | Admitting: Emergency Medicine

## 2014-07-13 ENCOUNTER — Encounter (HOSPITAL_COMMUNITY): Payer: Self-pay | Admitting: *Deleted

## 2014-07-13 DIAGNOSIS — R1011 Right upper quadrant pain: Secondary | ICD-10-CM | POA: Insufficient documentation

## 2014-07-13 DIAGNOSIS — Z87440 Personal history of urinary (tract) infections: Secondary | ICD-10-CM | POA: Insufficient documentation

## 2014-07-13 DIAGNOSIS — E669 Obesity, unspecified: Secondary | ICD-10-CM | POA: Insufficient documentation

## 2014-07-13 DIAGNOSIS — Z8719 Personal history of other diseases of the digestive system: Secondary | ICD-10-CM | POA: Insufficient documentation

## 2014-07-13 DIAGNOSIS — Z87448 Personal history of other diseases of urinary system: Secondary | ICD-10-CM | POA: Insufficient documentation

## 2014-07-13 DIAGNOSIS — Z3202 Encounter for pregnancy test, result negative: Secondary | ICD-10-CM | POA: Insufficient documentation

## 2014-07-13 DIAGNOSIS — J45909 Unspecified asthma, uncomplicated: Secondary | ICD-10-CM | POA: Insufficient documentation

## 2014-07-13 DIAGNOSIS — Z792 Long term (current) use of antibiotics: Secondary | ICD-10-CM | POA: Insufficient documentation

## 2014-07-13 DIAGNOSIS — R1031 Right lower quadrant pain: Secondary | ICD-10-CM | POA: Insufficient documentation

## 2014-07-13 DIAGNOSIS — R109 Unspecified abdominal pain: Secondary | ICD-10-CM

## 2014-07-13 DIAGNOSIS — Z8659 Personal history of other mental and behavioral disorders: Secondary | ICD-10-CM | POA: Insufficient documentation

## 2014-07-13 DIAGNOSIS — R110 Nausea: Secondary | ICD-10-CM | POA: Insufficient documentation

## 2014-07-13 LAB — URINALYSIS, ROUTINE W REFLEX MICROSCOPIC
Bilirubin Urine: NEGATIVE
Glucose, UA: NEGATIVE mg/dL
Ketones, ur: NEGATIVE mg/dL
Leukocytes, UA: NEGATIVE
NITRITE: NEGATIVE
PROTEIN: NEGATIVE mg/dL
Specific Gravity, Urine: 1.01 (ref 1.005–1.030)
UROBILINOGEN UA: 0.2 mg/dL (ref 0.0–1.0)
pH: 5.5 (ref 5.0–8.0)

## 2014-07-13 LAB — CBC WITH DIFFERENTIAL/PLATELET
BASOS PCT: 0 % (ref 0–1)
Basophils Absolute: 0 10*3/uL (ref 0.0–0.1)
EOS ABS: 0.1 10*3/uL (ref 0.0–0.7)
EOS PCT: 2 % (ref 0–5)
HCT: 37.4 % (ref 36.0–46.0)
Hemoglobin: 12.6 g/dL (ref 12.0–15.0)
LYMPHS ABS: 2 10*3/uL (ref 0.7–4.0)
Lymphocytes Relative: 35 % (ref 12–46)
MCH: 30.6 pg (ref 26.0–34.0)
MCHC: 33.7 g/dL (ref 30.0–36.0)
MCV: 90.8 fL (ref 78.0–100.0)
Monocytes Absolute: 0.4 10*3/uL (ref 0.1–1.0)
Monocytes Relative: 6 % (ref 3–12)
NEUTROS PCT: 57 % (ref 43–77)
Neutro Abs: 3.3 10*3/uL (ref 1.7–7.7)
Platelets: 280 10*3/uL (ref 150–400)
RBC: 4.12 MIL/uL (ref 3.87–5.11)
RDW: 12.2 % (ref 11.5–15.5)
WBC: 5.7 10*3/uL (ref 4.0–10.5)

## 2014-07-13 LAB — COMPREHENSIVE METABOLIC PANEL
ALT: 12 U/L (ref 0–35)
AST: 16 U/L (ref 0–37)
Albumin: 4.2 g/dL (ref 3.5–5.2)
Alkaline Phosphatase: 69 U/L (ref 39–117)
Anion gap: 10 (ref 5–15)
BUN: 16 mg/dL (ref 6–23)
CALCIUM: 9.4 mg/dL (ref 8.4–10.5)
CO2: 23 mmol/L (ref 19–32)
CREATININE: 0.71 mg/dL (ref 0.50–1.10)
Chloride: 106 mmol/L (ref 96–112)
GLUCOSE: 96 mg/dL (ref 70–99)
Potassium: 4.1 mmol/L (ref 3.5–5.1)
SODIUM: 139 mmol/L (ref 135–145)
TOTAL PROTEIN: 7.3 g/dL (ref 6.0–8.3)
Total Bilirubin: 0.4 mg/dL (ref 0.3–1.2)

## 2014-07-13 LAB — POC URINE PREG, ED: PREG TEST UR: NEGATIVE

## 2014-07-13 LAB — LIPASE, BLOOD: LIPASE: 23 U/L (ref 11–59)

## 2014-07-13 LAB — URINE MICROSCOPIC-ADD ON

## 2014-07-13 MED ORDER — MORPHINE SULFATE 4 MG/ML IJ SOLN: 4.0000 mg | Freq: Once | INTRAMUSCULAR | Status: DC

## 2014-07-13 MED ORDER — ACETAMINOPHEN 325 MG PO TABS
650.0000 mg | ORAL_TABLET | Freq: Once | ORAL | Status: AC
  Administered 2014-07-13: 650 mg via ORAL
  Filled 2014-07-13: qty 2

## 2014-07-13 NOTE — ED Notes (Addendum)
Per ems pt is from home, RLQ pain today since 0600, started around belly pain (burning sensation), moved to RUQ, rebound tenderness present. Pain also on left side of back. Pt has abscess tooth and has been on amoxicillin for 1 month.  Pt is trying to get pregnant.

## 2014-07-13 NOTE — ED Provider Notes (Signed)
CSN: [id number]     Arrival date & time 07/13/14  0900 History   First MD Initiated Contact with Patient 07/13/14 1039     Chief Complaint  Patient presents with  . Abdominal Pain     (Consider location/radiation/quality/duration/timing/severity/associated sxs/prior Treatment) HPI  24 year old female presents with right-sided abdominal pain since around 6 AM. She states it started all of a sudden and has been gradually worsening. Rates the pain as a 9/10. It feels like a burning type pain. Last night she went to bed nauseated but with no pain. Has not had any fevers. Also has pain on the left side of her back. No urinary symptoms or vaginal symptoms. Patient never had pain like this before. Patient is concerned about her gallbladder cousin her sister had similar symptoms with a gallbladder problem. Patient took ibuprofen last night. Is not currently nauseated.  Past Medical History  Diagnosis Date  . Asthma   . ADHD (attention deficit hyperactivity disorder)   . Headache(784.0)   . Ulcer   . Urinary tract infection    Past Surgical History  Procedure Laterality Date  . Wisdom tooth extraction    . Myringotomy     Family History  Problem Relation Age of Onset  . Anesthesia problems Mother   . Asthma Father   . Heart disease Father   . Diabetes Father   . Hearing loss Father   . Heart disease Paternal Uncle   . Cancer Maternal Grandmother   . Heart disease Maternal Grandfather   . Diabetes Maternal Grandfather   . Asthma Brother    History  Substance Use Topics  . Smoking status: Never Smoker   . Smokeless tobacco: Never Used  . Alcohol Use: No   OB History    Gravida Para Term Preterm AB TAB SAB Ectopic Multiple Living   0        0      Review of Systems  Constitutional: Negative for fever.  Gastrointestinal: Positive for nausea and abdominal pain. Negative for vomiting.  Genitourinary: Negative for dysuria, vaginal bleeding and vaginal discharge.   Musculoskeletal: Positive for back pain.  All other systems reviewed and are negative.     Allergies  Review of patient's allergies indicates no known allergies.  Home Medications   Prior to Admission medications   Medication Sig Start Date End Date Taking? Authorizing Provider  amoxicillin (AMOXIL) 500 MG capsule Take 500 mg by mouth 2 (two) times daily.   Yes Historical Provider, MD  ibuprofen (ADVIL,MOTRIN) 800 MG tablet Take 1 tablet (800 mg total) by mouth 3 (three) times daily. 05/30/14  Yes Harvie Heck, PA-C  ondansetron (ZOFRAN-ODT) 8 MG disintegrating tablet Take 8 mg by mouth every 8 (eight) hours as needed for nausea or vomiting.   Yes Historical Provider, MD   BP 122/70 mmHg  Pulse 76  Temp(Src) 98.3 F (36.8 C) (Oral)  Resp 18  SpO2 100%  LMP 05/11/2013 Physical Exam  Constitutional: She is oriented to person, place, and time. She appears well-developed and well-nourished.  obese  HENT:  Head: Normocephalic and atraumatic.  Right Ear: External ear normal.  Left Ear: External ear normal.  Nose: Nose normal.  Eyes: Right eye exhibits no discharge. Left eye exhibits no discharge.  Cardiovascular: Normal rate, regular rhythm and normal heart sounds.   Pulmonary/Chest: Effort normal and breath sounds normal.  Abdominal: Soft. There is tenderness in the right upper quadrant and right lower quadrant.  Focal tenderness to RUQ, right flank,  minimal RLQ tenderness  Neurological: She is alert and oriented to person, place, and time.  Skin: Skin is warm and dry.  Nursing note and vitals reviewed.   ED Course  Procedures (including critical care time) Labs Review Labs Reviewed  URINALYSIS, ROUTINE W REFLEX MICROSCOPIC - Abnormal; Notable for the following:    APPearance CLOUDY (*)    Hgb urine dipstick TRACE (*)    All other components within normal limits  CBC WITH DIFFERENTIAL/PLATELET  COMPREHENSIVE METABOLIC PANEL  LIPASE, BLOOD  URINE MICROSCOPIC-ADD ON   POC URINE PREG, ED    Imaging Review US Abdomen Limited  07/13/2014   CLINICAL DATA:  Right lower quadrant radiating to the right upper abdominal quadrant.  EXAM: US ABDOMEN LIMITED - RIGHT UPPER QUADRANT  COMPARISON:  CT abdomen pelvis - 07/10/2011  FINDINGS: Gallbladder:  Sonographically normal. No echogenic gallstones or gall sludge. No gallbladder wall thickening or pericholecystic fluid. Negative sonographic Murphy's sign.  Common bile duct:  Diameter: Normal in size measuring 4.9 mm in diameter  Liver:  Homogeneous hepatic echotexture. Note is made of a 0.9 x 0.8 x 1.0 cm echogenic nodule within the right lobe of the liver which is too small to adequately characterize though favored to represent a hepatic hemangioma. No definite evidence of intrahepatic biliary ductal dilatation. No ascites.  IMPRESSION: 1. No explanation for patient's radiating right upper quadrant abdominal pain. Specifically, no evidence of cholelithiasis. 2. Sub centimeter echogenic nodule within the right lobe of the liver, too small to accurately characterize though favored to represent a hemangioma.   Electronically Signed   By: Sandi Mariscal M.D.   On: 07/13/2014 12:43     EKG Interpretation None      MDM   Final diagnoses:  Abdominal pain    Patient's pain is most localized to RUQ. Appears mild to moderate. Essentially gone after tylenol. Repeat exam shows no tenderness in lower abdomen. I have low suspicion for appendicitis, torsion or PID. I feel she can be treated symptomatically with oral pain control and follow up with PCP. Discussed return precautions and if symptoms progress or change she may need further imaging and evaluation.    Sherwood Gambler, MD 07/13/14 [phone]

## 2014-07-13 NOTE — Discharge Instructions (Signed)
Abdominal Pain, Women °Abdominal (stomach, pelvic, or belly) pain can be caused by many things. It is important to tell your doctor: °· The location of the pain. °· Does it come and go or is it present all the time? °· Are there things that start the pain (eating certain foods, exercise)? °· Are there other symptoms associated with the pain (fever, nausea, vomiting, diarrhea)? °All of this is helpful to know when trying to find the cause of the pain. °CAUSES  °· Stomach: virus or bacteria infection, or ulcer. °· Intestine: appendicitis (inflamed appendix), regional ileitis (Crohn's disease), ulcerative colitis (inflamed colon), irritable bowel syndrome, diverticulitis (inflamed diverticulum of the colon), or cancer of the stomach or intestine. °· Gallbladder disease or stones in the gallbladder. °· Kidney disease, kidney stones, or infection. °· Pancreas infection or cancer. °· Fibromyalgia (pain disorder). °· Diseases of the female organs: °¨ Uterus: fibroid (non-cancerous) tumors or infection. °¨ Fallopian tubes: infection or tubal pregnancy. °¨ Ovary: cysts or tumors. °¨ Pelvic adhesions (scar tissue). °¨ Endometriosis (uterus lining tissue growing in the pelvis and on the pelvic organs). °¨ Pelvic congestion syndrome (female organs filling up with blood just before the menstrual period). °¨ Pain with the menstrual period. °¨ Pain with ovulation (producing an egg). °¨ Pain with an IUD (intrauterine device, birth control) in the uterus. °¨ Cancer of the female organs. °· Functional pain (pain not caused by a disease, may improve without treatment). °· Psychological pain. °· Depression. °DIAGNOSIS  °Your doctor will decide the seriousness of your pain by doing an examination. °· Blood tests. °· X-rays. °· Ultrasound. °· CT scan (computed tomography, special type of X-ray). °· MRI (magnetic resonance imaging). °· Cultures, for infection. °· Barium enema (dye inserted in the large intestine, to better view it with  X-rays). °· Colonoscopy (looking in intestine with a lighted tube). °· Laparoscopy (minor surgery, looking in abdomen with a lighted tube). °· Major abdominal exploratory surgery (looking in abdomen with a large incision). °TREATMENT  °The treatment will depend on the cause of the pain.  °· Many cases can be observed and treated at home. °· Over-the-counter medicines recommended by your caregiver. °· Prescription medicine. °· Antibiotics, for infection. °· Birth control pills, for painful periods or for ovulation pain. °· Hormone treatment, for endometriosis. °· Nerve blocking injections. °· Physical therapy. °· Antidepressants. °· Counseling with a psychologist or psychiatrist. °· Minor or major surgery. °HOME CARE INSTRUCTIONS  °· Do not take laxatives, unless directed by your caregiver. °· Take over-the-counter pain medicine only if ordered by your caregiver. Do not take aspirin because it can cause an upset stomach or bleeding. °· Try a clear liquid diet (broth or water) as ordered by your caregiver. Slowly move to a bland diet, as tolerated, if the pain is related to the stomach or intestine. °· Have a thermometer and take your temperature several times a day, and record it. °· Bed rest and sleep, if it helps the pain. °· Avoid sexual intercourse, if it causes pain. °· Avoid stressful situations. °· Keep your follow-up appointments and tests, as your caregiver orders. °· If the pain does not go away with medicine or surgery, you may try: °¨ Acupuncture. °¨ Relaxation exercises (yoga, meditation). °¨ Group therapy. °¨ Counseling. °SEEK MEDICAL CARE IF:  °· You notice certain foods cause stomach pain. °· Your home care treatment is not helping your pain. °· You need stronger pain medicine. °· You want your IUD removed. °· You feel faint or   lightheaded. °· You develop nausea and vomiting. °· You develop a rash. °· You are having side effects or an allergy to your medicine. °SEEK IMMEDIATE MEDICAL CARE IF:  °· Your  pain does not go away or gets worse. °· You have a fever. °· Your pain is felt only in portions of the abdomen. The right side could possibly be appendicitis. The left lower portion of the abdomen could be colitis or diverticulitis. °· You are passing blood in your stools (bright red or black tarry stools, with or without vomiting). °· You have blood in your urine. °· You develop chills, with or without a fever. °· You pass out. °MAKE SURE YOU:  °· Understand these instructions. °· Will watch your condition. °· Will get help right away if you are not doing well or get worse. °Document Released: 02/10/2007 Document Revised: 08/30/2013 Document Reviewed: 03/02/2009 °ExitCare® Patient Information ©2015 [employer]. This information is not intended to replace advice given to you by your health care provider. Make sure you discuss any questions you have with your health care provider. ° °

## 2015-03-29 ENCOUNTER — Emergency Department (HOSPITAL_COMMUNITY): Payer: BLUE CROSS/BLUE SHIELD

## 2015-03-29 ENCOUNTER — Emergency Department (HOSPITAL_COMMUNITY)
Admission: EM | Admit: 2015-03-29 | Discharge: 2015-03-30 | Disposition: A | Discharge status: Home / Self Care | Payer: BLUE CROSS/BLUE SHIELD | Attending: Emergency Medicine | Admitting: Emergency Medicine

## 2015-03-29 ENCOUNTER — Encounter (HOSPITAL_COMMUNITY): Payer: Self-pay | Admitting: Emergency Medicine

## 2015-03-29 DIAGNOSIS — Z3202 Encounter for pregnancy test, result negative: Secondary | ICD-10-CM | POA: Diagnosis not present

## 2015-03-29 DIAGNOSIS — J45909 Unspecified asthma, uncomplicated: Secondary | ICD-10-CM | POA: Insufficient documentation

## 2015-03-29 DIAGNOSIS — R197 Diarrhea, unspecified: Secondary | ICD-10-CM | POA: Insufficient documentation

## 2015-03-29 DIAGNOSIS — Z87440 Personal history of urinary (tract) infections: Secondary | ICD-10-CM | POA: Diagnosis not present

## 2015-03-29 DIAGNOSIS — Z8659 Personal history of other mental and behavioral disorders: Secondary | ICD-10-CM | POA: Diagnosis not present

## 2015-03-29 DIAGNOSIS — Z872 Personal history of diseases of the skin and subcutaneous tissue: Secondary | ICD-10-CM | POA: Insufficient documentation

## 2015-03-29 DIAGNOSIS — R110 Nausea: Secondary | ICD-10-CM | POA: Diagnosis not present

## 2015-03-29 DIAGNOSIS — R51 Headache: Secondary | ICD-10-CM | POA: Insufficient documentation

## 2015-03-29 DIAGNOSIS — R519 Headache, unspecified: Secondary | ICD-10-CM

## 2015-03-29 LAB — COMPREHENSIVE METABOLIC PANEL
ALBUMIN: 4.3 g/dL (ref 3.5–5.0)
ALT: 13 U/L — AB (ref 14–54)
AST: 15 U/L (ref 15–41)
Alkaline Phosphatase: 68 U/L (ref 38–126)
Anion gap: 5 (ref 5–15)
BUN: 11 mg/dL (ref 6–20)
CALCIUM: 9.7 mg/dL (ref 8.9–10.3)
CO2: 27 mmol/L (ref 22–32)
CREATININE: 0.7 mg/dL (ref 0.44–1.00)
Chloride: 107 mmol/L (ref 101–111)
GFR calc Af Amer: >60 mL/min (ref >60)
GFR calc non Af Amer: >60 mL/min (ref >60)
Glucose, Bld: 102 mg/dL — ABNORMAL HIGH (ref 65–99)
Potassium: 4 mmol/L (ref 3.5–5.1)
SODIUM: 139 mmol/L (ref 135–145)
Total Bilirubin: 0.5 mg/dL (ref 0.3–1.2)
Total Protein: 7.7 g/dL (ref 6.5–8.1)

## 2015-03-29 LAB — I-STAT BETA HCG BLOOD, ED (MC, WL, AP ONLY)

## 2015-03-29 LAB — CBC
HCT: 38.1 % (ref 36.0–46.0)
Hemoglobin: 12.9 g/dL (ref 12.0–15.0)
MCH: 30.9 pg (ref 26.0–34.0)
MCHC: 33.9 g/dL (ref 30.0–36.0)
MCV: 91.4 fL (ref 78.0–100.0)
PLATELETS: 296 10*3/uL (ref 150–400)
RBC: 4.17 MIL/uL (ref 3.87–5.11)
RDW: 12 % (ref 11.5–15.5)
WBC: 7.7 10*3/uL (ref 4.0–10.5)

## 2015-03-29 LAB — LIPASE, BLOOD: Lipase: 25 U/L (ref 11–51)

## 2015-03-29 MED ORDER — ONDANSETRON 4 MG PO TBDP
4.0000 mg | ORAL_TABLET | Freq: Once | ORAL | Status: AC
Start: 2015-03-29 — End: 2015-03-29
  Administered 2015-03-29: 4 mg via ORAL
  Filled 2015-03-29: qty 1

## 2015-03-29 MED ORDER — SODIUM CHLORIDE 0.9 % IV BOLUS (SEPSIS)
1000.0000 mL | Freq: Once | INTRAVENOUS | Status: AC
  Administered 2015-03-29: 1000 mL via INTRAVENOUS

## 2015-03-29 NOTE — ED Notes (Signed)
Pt states she has had nausea, diarrhea, and headache since yesterday. Denies vomiting. Alert and oriented. Neuro intact.

## 2015-03-29 NOTE — ED Notes (Addendum)
Patient transported to CT 

## 2015-03-29 NOTE — ED Notes (Signed)
Family at bedside. 

## 2015-03-30 LAB — URINE MICROSCOPIC-ADD ON

## 2015-03-30 LAB — URINALYSIS, ROUTINE W REFLEX MICROSCOPIC
BILIRUBIN URINE: NEGATIVE
Glucose, UA: NEGATIVE mg/dL
Ketones, ur: NEGATIVE mg/dL
Leukocytes, UA: NEGATIVE
Nitrite: NEGATIVE
Protein, ur: NEGATIVE mg/dL
SPECIFIC GRAVITY, URINE: 1.017 (ref 1.005–1.030)
pH: 6 (ref 5.0–8.0)

## 2015-03-30 MED ORDER — PROCHLORPERAZINE MALEATE 5 MG PO TABS: 5.0000 mg | ORAL_TABLET | Freq: Two times a day (BID) | ORAL | Status: DC | PRN

## 2015-03-30 NOTE — ED Provider Notes (Signed)
CSN: [id number]     Arrival date & time 03/29/15  2206 History   First MD Initiated Contact with Patient 03/29/15 2315     Chief Complaint  Patient presents with  . Headache  . Nausea  . Diarrhea    HPI  Monica Ramos is an 24 y.o. female with history of chronic headaches and asthma who presents to the ED for evaluation of headache. She states that her headache started suddenly about 2 hours PTA. She states that it is in the back of her head. It does not radiate. She reports associated nausea. Denies emesis. She also reports double vision and blurred vision. She states that she felt weak when she was walking to the ED earlier. Pt states that she has had a history of recurrent occipital headaches in the past but this is the worst one she has ever had. She states she has never had visual disturbances, nausea, or weakness with her prior headaches. States she has never had pcp or neruology f/u. Pt also reports three episodes of watery non-bloody diarrhea today. Denies abdominal pain. Denies fever, chills, chest pain, SOB. Denies recent travel. Denies urinary symptoms.  Past Medical History  Diagnosis Date  . Asthma   . ADHD (attention deficit hyperactivity disorder)   . Headache(784.0)   . Ulcer   . Urinary tract infection    Past Surgical History  Procedure Laterality Date  . Wisdom tooth extraction    . Myringotomy     Family History  Problem Relation Age of Onset  . Anesthesia problems Mother   . Asthma Father   . Heart disease Father   . Diabetes Father   . Hearing loss Father   . Heart disease Paternal Uncle   . Cancer Maternal Grandmother   . Heart disease Maternal Grandfather   . Diabetes Maternal Grandfather   . Asthma Brother    Social History  Substance Use Topics  . Smoking status: Never Smoker   . Smokeless tobacco: Never Used  . Alcohol Use: No   OB History    Gravida Para Term Preterm AB TAB SAB Ectopic Multiple Living   0        0      Review of Systems   All other systems reviewed and are negative.     Allergies  Review of patient's allergies indicates no known allergies.  Home Medications   Prior to Admission medications   Medication Sig Start Date End Date Taking? Authorizing Provider  ibuprofen (ADVIL,MOTRIN) 200 MG tablet Take 800 mg by mouth every 6 (six) hours as needed for moderate pain.   Yes Historical Provider, MD  ibuprofen (ADVIL,MOTRIN) 800 MG tablet Take 1 tablet (800 mg total) by mouth 3 (three) times daily. Patient not taking: Reported on 03/29/2015 05/30/14   Harvie Heck, PA-C   BP 137/85 mmHg  Pulse 101  Temp(Src) 98.1 F (36.7 C) (Oral)  Resp 18  SpO2 100%  LMP 03/15/2015 (Approximate) Physical Exam  Constitutional: She is oriented to person, place, and time. No distress.  HENT:  Right Ear: External ear normal.  Left Ear: External ear normal.  Nose: Nose normal.  Mouth/Throat: Oropharynx is clear and moist. No oropharyngeal exudate.  Eyes: Conjunctivae and EOM are normal. Pupils are equal, round, and reactive to light.  Neck: Normal range of motion. Neck supple.  Cardiovascular: Normal rate, regular rhythm, normal heart sounds and intact distal pulses.   No murmur heard. Pulmonary/Chest: Effort normal and breath sounds normal. No respiratory  distress. She exhibits no tenderness.  Abdominal: Soft. Bowel sounds are normal. She exhibits no distension. There is no tenderness. There is no rebound and no guarding.  Musculoskeletal: Normal range of motion. She exhibits no edema or tenderness.  Lymphadenopathy:    She has no cervical adenopathy.  Neurological: She is alert and oriented to person, place, and time. She has normal strength. No cranial nerve deficit or sensory deficit. She displays a negative Romberg sign. Coordination and gait normal.  Skin: Skin is warm and dry. She is not diaphoretic.  Psychiatric: She has a normal mood and affect.  Nursing note and vitals reviewed.   ED Course  Procedures  (including critical care time) Labs Review Labs Reviewed  COMPREHENSIVE METABOLIC PANEL - Abnormal; Notable for the following:    Glucose, Bld 102 (*)    ALT 13 (*)    All other components within normal limits  LIPASE, BLOOD  CBC  URINALYSIS, ROUTINE W REFLEX MICROSCOPIC (NOT AT [facility])  I-STAT BETA HCG BLOOD, ED ([facility], [facility], [facility] ONLY)    Imaging Review Ct Head Wo Contrast  03/30/2015  CLINICAL DATA:  Initial evaluation for one-day history of acute headache. EXAM: CT HEAD WITHOUT CONTRAST TECHNIQUE: Contiguous axial images were obtained from the base of the skull through the vertex without intravenous contrast. COMPARISON:  Prior study from 04/22/2014. FINDINGS: There is no acute intracranial hemorrhage or infarct. No mass lesion or midline shift. Gray-white matter differentiation is well maintained. Ventricles are normal in size without evidence of hydrocephalus. CSF containing spaces are within normal limits. No extra-axial fluid collection. The calvarium is intact. Orbital soft tissues are within normal limits. Mild opacity within the left sphenoid sinus. Paranasal sinuses are otherwise clear. No mastoid effusion. Scalp soft tissues are unremarkable. IMPRESSION: Normal head CT with no acute intracranial process identified. Electronically Signed   By: Jeannine Boga M.D.   On: 03/30/2015 00:02   I have personally reviewed and evaluated these images and lab results as part of my medical decision-making.   EKG Interpretation None      MDM   Final diagnoses:  Acute nonintractable headache, unspecified headache type    Pt's neuro exam is completely intact. She is afebrile. However, given that she states that the headache is the worst she has ever had with new symptoms (visual disturbance, subjective weakness), will get CT head. Her labs are otherwise unremarkable. She is slightly tachycardic which I suspect is secondary to combination of pain and hypovolemia from diarrhea.  Will give fluid  bolus and zofran here.  CT negative. HR down to 70. Pt is stable for discharge. Will give resource guide for PCP follow-up. Return precautions given.  Anne Ng, PA-C 03/30/15 [place], MD 03/30/15 [phone]

## 2015-03-30 NOTE — Discharge Instructions (Signed)
You were seen in the ED today for evaluation of a headache. Your workup was completely unremarkable. Your CT scan shows no abnormalities. I will give you a prescription to take as needed for headaches. Please follow-up with primary care for further evaluation and management of your headaches. If you do not have one please contact one of the providers listed below. Return to the ER for new or worsening symptoms.     Emergency Department Resource Guide 1) Find a Doctor and Pay Out of Pocket Although you won't have to find out who is covered by your insurance plan, it is a good idea to ask around and get recommendations. You will then need to call the office and see if the doctor you have chosen will accept you as a new patient and what types of options they offer for patients who are self-pay. Some doctors offer discounts or will set up payment plans for their patients who do not have insurance, but you will need to ask so you aren't surprised when you get to your appointment.  2) Contact Your Local Health Department Not all health departments have doctors that can see patients for sick visits, but many do, so it is worth a call to see if yours does. If you don't know where your local health department is, you can check in your phone book. The CDC also has a tool to help you locate your state's health department, and many state websites also have listings of all of their local health departments.  3) Find a [facility] If your illness is not likely to be very severe or complicated, you may want to try a walk in clinic. These are popping up all over the country in pharmacies, drugstores, and shopping centers. They're usually staffed by nurse practitioners or physician assistants that have been trained to treat common illnesses and complaints. They're usually fairly quick and inexpensive. However, if you have serious medical issues or chronic medical problems, these are probably not your best  option.  No Primary Care Doctor: - Call Health Connect at  [phone] - they can help you locate a primary care doctor that  accepts your insurance, provides certain services, etc. - Physician Referral [phone]  Emergency Department Resource Guide 1) Find a Doctor and Pay Out of Pocket Although you won't have to find out who is covered by your insurance plan, it is a good idea to ask around and get recommendations. You will then need to call the office and see if the doctor you have chosen will accept you as a new patient and what types of options they offer for patients who are self-pay. Some doctors offer discounts or will set up payment plans for their patients who do not have insurance, but you will need to ask so you aren't surprised when you get to your appointment.  2) Contact Your Local Health Department Not all health departments have doctors that can see patients for sick visits, but many do, so it is worth a call to see if yours does. If you don't know where your local health department is, you can check in your phone book. The CDC also has a tool to help you locate your state's health department, and many state websites also have listings of all of their local health departments.  3) Find a [facility] If your illness is not likely to be very severe or complicated, you may want to try a walk in clinic. These are popping up  all over the country in pharmacies, drugstores, and shopping centers. They're usually staffed by nurse practitioners or physician assistants that have been trained to treat common illnesses and complaints. They're usually fairly quick and inexpensive. However, if you have serious medical issues or chronic medical problems, these are probably not your best option.  No Primary Care Doctor: - Call Health Connect at  [phone] - they can help you locate a primary care doctor that  accepts your insurance, provides certain services, etc. - Physician Referral  Service- [phone]  Chronic Pain Problems: Organization         Address  Phone   Notes  [facility]  [phone] Patients need to be referred by their primary care doctor.   Medication Assistance: Organization         Address  Phone   Notes  [facility] Medication [facility] [address] [place], [place] [address] [phone] --Must be a resident of [facility] -- Must have NO insurance coverage whatsoever (no Medicaid/ Medicare, etc.) -- The pt. MUST have a primary care doctor that directs their care regularly and follows them in the community   [facility]  [phone]   [employer]  [phone]    Agencies that provide inexpensive medical care: Organization         Address  Phone   Notes  [place]  [phone]   [facility]    [phone]   [facility] [place], [place] [address] [phone]   [place] [address] [place] [phone]   [facility]    [phone]   [facility]    [phone]   [place] and [address], [place] Phone:  [phone], Fax:  [phone] Hours of Operation:  9 am - 6 pm, M-F.  Also accepts Medicaid/Medicare and self-pay.  [facility] for [place] [address], [address], [place] Phone: [phone], Fax: [phone]. Hours of Operation:  8:30 am - 5:30 pm, M-F.  Also accepts Medicaid and self-pay.  [facility] [address], [place] Phone: [phone]   [place], [place], [place] [phone], Ext. [phone] Mondays & Thursdays: 7-9 AM.  First 15 patients are seen on a first come, first serve basis.    [place] Providers:  Organization         Address  Phone   Notes  [facility] [address], [place] [phone] Also accepts self-pay patients.  [facility] [id number] [place], [place]  [phone]   [place], [address], [place] [phone]   [facility] [address] [place] [phone]   Lucianne Lei [address], [place]   [phone] Only accepts [place] patients after they have their name applied to their card.   Self-Pay (no insurance) in [facility]:  Organization         Address  Phone   Notes  Sickle Cell Patients, [facility] [place] [phone]   [facility] [place] [phone]   [facility] [place]  [address] [place]  [address], [place] [phone]   [facility]/Dr. Osei-Bonsu  [address], [place] or [address], [place] [phone] Phone number for both [place] and [place] locations is the same.  [facility] [address], [place] [phone]   [facility] [address], [place] or [address] [phone]   [facility] [address] [place] [phone], phone; [phone], fax Sees patients 1st and 3rd Saturday of every month.  Must not qualify for public or private insurance (i.e. Medicaid, Medicare, [place] Health Choice, Veterans' Benefits)  Household income should be no more than 200% of the poverty level The clinic cannot treat you if you are pregnant or think you are pregnant  Sexually transmitted diseases are not treated at the clinic.

## 2015-04-15 ENCOUNTER — Telehealth (HOSPITAL_COMMUNITY): Payer: Self-pay

## 2015-06-06 ENCOUNTER — Inpatient Hospital Stay (HOSPITAL_COMMUNITY)
Admission: AD | Admit: 2015-06-06 | Discharge: 2015-06-06 | Disposition: A | Discharge status: Home / Self Care | Payer: BLUE CROSS/BLUE SHIELD | Source: Ambulatory Visit | Attending: Family Medicine | Admitting: Family Medicine

## 2015-06-06 ENCOUNTER — Encounter (HOSPITAL_COMMUNITY): Payer: Self-pay | Admitting: *Deleted

## 2015-06-06 DIAGNOSIS — N911 Secondary amenorrhea: Secondary | ICD-10-CM | POA: Diagnosis not present

## 2015-06-06 DIAGNOSIS — J45909 Unspecified asthma, uncomplicated: Secondary | ICD-10-CM | POA: Diagnosis not present

## 2015-06-06 DIAGNOSIS — N910 Primary amenorrhea: Secondary | ICD-10-CM | POA: Insufficient documentation

## 2015-06-06 DIAGNOSIS — R51 Headache: Secondary | ICD-10-CM | POA: Diagnosis not present

## 2015-06-06 DIAGNOSIS — Z3202 Encounter for pregnancy test, result negative: Secondary | ICD-10-CM | POA: Diagnosis not present

## 2015-06-06 DIAGNOSIS — F909 Attention-deficit hyperactivity disorder, unspecified type: Secondary | ICD-10-CM | POA: Insufficient documentation

## 2015-06-06 DIAGNOSIS — R109 Unspecified abdominal pain: Secondary | ICD-10-CM | POA: Diagnosis not present

## 2015-06-06 DIAGNOSIS — Z87440 Personal history of urinary (tract) infections: Secondary | ICD-10-CM | POA: Insufficient documentation

## 2015-06-06 LAB — URINALYSIS, ROUTINE W REFLEX MICROSCOPIC
Bilirubin Urine: NEGATIVE
Glucose, UA: NEGATIVE mg/dL
Ketones, ur: NEGATIVE mg/dL
Leukocytes, UA: NEGATIVE
Nitrite: NEGATIVE
Protein, ur: NEGATIVE mg/dL
Specific Gravity, Urine: 1.015 (ref 1.005–1.030)
pH: 7.5 (ref 5.0–8.0)

## 2015-06-06 LAB — URINE MICROSCOPIC-ADD ON

## 2015-06-06 LAB — HCG, QUANTITATIVE, PREGNANCY

## 2015-06-06 LAB — POCT PREGNANCY, URINE: Preg Test, Ur: NEGATIVE

## 2015-06-06 NOTE — MAU Note (Addendum)
No period since Dec.  Has been tired, breasts are tender.  3 neg HPT.  Was told it might be a tubal preg. (explained to pt and her mother that if you are preg, regardless of location of the pregnancy- you will have a positive test)

## 2015-06-06 NOTE — MAU Note (Signed)
Urine in lab 

## 2015-06-06 NOTE — MAU Provider Note (Signed)
History   nulparous 25 yo female in with c/o crampy low abd pain for several days now and no period since December. States periods are usually regular. Concerned she might have ectopic preg. Also c/o fatigue, denies any changes in weight. Pt is married and denies any STD risk and not on any birth control at present time.  CSN: [id number]  Arrival date & time 06/06/15  1408   None     Chief Complaint  Patient presents with  . Possible Pregnancy    HPI  Past Medical History  Diagnosis Date  . Asthma   . ADHD (attention deficit hyperactivity disorder)   . Headache(784.0)   . Ulcer   . Urinary tract infection     Past Surgical History  Procedure Laterality Date  . Wisdom tooth extraction    . Myringotomy      Family History  Problem Relation Age of Onset  . Anesthesia problems Mother   . Asthma Father   . Heart disease Father   . Diabetes Father   . Hearing loss Father   . Heart disease Paternal Uncle   . Cancer Maternal Grandmother   . Heart disease Maternal Grandfather   . Diabetes Maternal Grandfather   . Asthma Brother     Social History  Substance Use Topics  . Smoking status: Never Smoker   . Smokeless tobacco: Never Used  . Alcohol Use: No    OB History    Gravida Para Term Preterm AB TAB SAB Ectopic Multiple Living   0        0       Review of Systems  Constitutional: Positive for fatigue.  HENT: Negative.   Eyes: Negative.   Respiratory: Negative.   Cardiovascular: Negative.   Gastrointestinal: Positive for abdominal pain.  Endocrine: Negative.   Genitourinary: Negative.   Musculoskeletal: Negative.   Skin: Negative.   Allergic/Immunologic: Negative.   Neurological: Negative.   Hematological: Negative.   Psychiatric/Behavioral: Negative.     Allergies  Review of patient's allergies indicates no known allergies.  Home Medications  No current outpatient prescriptions on file.  BP 123/78 mmHg  Pulse 93  Temp(Src) 98.4 F (36.9 C)  (Oral)  Resp 18  Wt 247 lb (112.038 kg)  LMP 04/04/2015  Physical Exam  Constitutional: She is oriented to person, place, and time. She appears well-developed and well-nourished.  HENT:  Head: Normocephalic.  Eyes: Pupils are equal, round, and reactive to light.  Neck: Normal range of motion.  Cardiovascular: Normal rate, regular rhythm, normal heart sounds and intact distal pulses.   Pulmonary/Chest: Effort normal and breath sounds normal.  Abdominal: Soft. Bowel sounds are normal.  Musculoskeletal: Normal range of motion.  Neurological: She is alert and oriented to person, place, and time. She has normal reflexes.  Skin: Skin is warm and dry.  Psychiatric: She has a normal mood and affect. Her behavior is normal. Judgment and thought content normal.    MAU Course  Procedures (including critical care time)  Labs Reviewed  URINALYSIS, [place] (NOT AT [facility]) - Abnormal; Notable for the following:    Hgb urine dipstick TRACE (*)    All other components within normal limits  URINE MICROSCOPIC-ADD ON - Abnormal; Notable for the following:    Squamous Epithelial / LPF 6-30 (*)    Bacteria, UA FEW (*)    All other components within normal limits  HCG, QUANTITATIVE, PREGNANCY  POCT PREGNANCY, URINE   No results found.  No diagnosis found.    MDM  Amenorrhea x 2 mos, quant preg test neg will d/c home to f/u with primary care provider

## 2015-06-06 NOTE — Discharge Instructions (Signed)
Secondary Amenorrhea Secondary amenorrhea is the stopping of menstrual flow for 3-6 months in a female who has previously had periods. There are many possible causes. Most of these causes are not serious. Usually, treating the underlying problem causing the loss of menses will return your periods to normal. CAUSES  Some common and uncommon causes of not menstruating include:  Malnutrition.  Low blood sugar (hypoglycemia).  Polycystic ovary disease.  Stress or fear.  Breastfeeding.  Hormone imbalance.  Ovarian failure.  Medicines.  Extreme obesity.  Cystic fibrosis.  Low body weight or drastic weight reduction from any cause.  Early menopause.  Removal of ovaries or uterus.  Contraceptives.  Illness.  Long-term (chronic) illnesses.  Cushing syndrome.  Thyroid problems.  Birth control pills, patches, or vaginal rings for birth control. RISK FACTORS You may be at greater risk of secondary amenorrhea if:  You have a family history of this condition.  You have an eating disorder.  You do athletic training. DIAGNOSIS  A diagnosis is made by your health care provider taking a medical history and doing a physical exam. This will include a pelvic exam to check for problems with your reproductive organs. Pregnancy must be ruled out. Often, numerous blood tests are done to measure different hormones in the body. Urine testing may be done. Specialized exams (ultrasound, CT scan, MRI, or hysteroscopy) may have to be done as well as measuring the body mass index (BMI). TREATMENT  Treatment depends on the cause of the amenorrhea. If an eating disorder is present, this can be treated with an adequate diet and therapy. Chronic illnesses may improve with treatment of the illness. Amenorrhea may be corrected with medicines, lifestyle changes, or surgery. If the amenorrhea cannot be corrected, it is sometimes possible to create a false menstruation with medicines. HOME CARE  INSTRUCTIONS  Maintain a healthy diet.  Manage weight problems.  Exercise regularly but not excessively.  Get adequate sleep.  Manage stress.  Be aware of changes in your menstrual cycle. Keep a record of when your periods occur. Note the date your period starts, how long it lasts, and any problems. SEEK MEDICAL CARE IF: Your symptoms do not get better with treatment.   This information is not intended to replace advice given to you by your health care provider. Make sure you discuss any questions you have with your health care provider.   Document Released: 05/27/2006 Document Revised: 05/06/2014 Document Reviewed: 10/01/2012 Elsevier Interactive Patient Education 2016 Elsevier Inc.  

## 2015-07-11 ENCOUNTER — Inpatient Hospital Stay (HOSPITAL_COMMUNITY)
Admission: AD | Admit: 2015-07-11 | Discharge: 2015-07-11 | Disposition: A | Discharge status: Home / Self Care | Payer: BLUE CROSS/BLUE SHIELD | Source: Ambulatory Visit | Attending: Obstetrics and Gynecology | Admitting: Obstetrics and Gynecology

## 2015-07-11 ENCOUNTER — Encounter (HOSPITAL_COMMUNITY): Payer: Self-pay | Admitting: *Deleted

## 2015-07-11 DIAGNOSIS — R109 Unspecified abdominal pain: Secondary | ICD-10-CM | POA: Insufficient documentation

## 2015-07-11 DIAGNOSIS — N912 Amenorrhea, unspecified: Secondary | ICD-10-CM | POA: Insufficient documentation

## 2015-07-11 DIAGNOSIS — Z3202 Encounter for pregnancy test, result negative: Secondary | ICD-10-CM | POA: Insufficient documentation

## 2015-07-11 LAB — URINALYSIS, ROUTINE W REFLEX MICROSCOPIC
Bilirubin Urine: NEGATIVE
GLUCOSE, UA: NEGATIVE mg/dL
KETONES UR: NEGATIVE mg/dL
Leukocytes, UA: NEGATIVE
Nitrite: NEGATIVE
PH: 7.5 (ref 5.0–8.0)
PROTEIN: NEGATIVE mg/dL
Specific Gravity, Urine: 1.015 (ref 1.005–1.030)

## 2015-07-11 LAB — CBC
HCT: 36.3 % (ref 36.0–46.0)
Hemoglobin: 12.4 g/dL (ref 12.0–15.0)
MCH: 30.8 pg (ref 26.0–34.0)
MCHC: 34.2 g/dL (ref 30.0–36.0)
MCV: 90.3 fL (ref 78.0–100.0)
PLATELETS: 298 10*3/uL (ref 150–400)
RBC: 4.02 MIL/uL (ref 3.87–5.11)
RDW: 12.4 % (ref 11.5–15.5)
WBC: 9.5 10*3/uL (ref 4.0–10.5)

## 2015-07-11 LAB — URINE MICROSCOPIC-ADD ON: WBC UA: NONE SEEN WBC/hpf (ref 0–5)

## 2015-07-11 LAB — WET PREP, GENITAL
Clue Cells Wet Prep HPF POC: NONE SEEN
SPERM: NONE SEEN
TRICH WET PREP: NONE SEEN
WBC, Wet Prep HPF POC: NONE SEEN
Yeast Wet Prep HPF POC: NONE SEEN

## 2015-07-11 LAB — HCG, QUANTITATIVE, PREGNANCY

## 2015-07-11 LAB — POCT PREGNANCY, URINE: PREG TEST UR: NEGATIVE

## 2015-07-11 NOTE — Discharge Instructions (Signed)
Pregnancy Test Information WHAT IS A PREGNANCY TEST? A pregnancy test is used to detect the presence of human chorionic gonadotropin (hCG) in a sample of your urine or blood. hCG is a hormone produced by the cells of the placenta. The placenta is the organ that forms to nourish and support a developing baby. This test requires a sample of either blood or urine. A pregnancy test determines whether you are pregnant or not. HOW ARE PREGNANCY TESTS DONE? Pregnancy tests are done using a home pregnancy test or having a blood or urine test done at your health care provider's office.  Home pregnancy tests require a urine sample.  Most kits use a plastic testing device with a strip of paper that indicates whether there is hCG in your urine.  Follow the test instructions very carefully.  After you urinate on the test stick, markings will appear to let you know whether you are pregnant.  For best results, use your first urine of the morning. That is when the concentration of hCG is highest. Having a blood test to check for pregnancy requires a sample of blood drawn from a vein in your hand or arm. Your health care provider will send your sample to a lab for testing. Results of a pregnancy test will be positive or negative. IS ONE TYPE OF PREGNANCY TEST BETTER THAN ANOTHER? In some cases, a blood test will return a positive result even if a urine test was negative because blood tests are more sensitive. This means blood tests can detect hCG earlier than home pregnancy tests.  HOW ACCURATE ARE HOME PREGNANCY TESTS?  Both types of pregnancy tests are very accurate.  A blood test is about 98% accurate.  When you are far enough along in your pregnancy and when used correctly, home pregnancy tests are equally accurate. CAN ANYTHING INTERFERE WITH HOME PREGNANCY TEST RESULTS?  It is possible for certain conditions to cause an inaccurate test result (false positive or falsenegative).  A false positive is a  positive test result when you are not pregnant. This can happen if you:  Are taking certain medicines, including anticonvulsants or tranquilizers.  Have certain proteins in your blood.  A false negative is a negative test result when you are pregnant. This can happen if you:  Took the test before there was enough hCG to detect. A pregnancy test will not be positive in most women until 3-4 weeks after conception.  Drank a lot of liquid before the test. Diluted urine samples can sometimes give an inaccurate result.  Take certain medicines, such as water pills (diuretics) or some antihistamines. WHAT SHOULD I DO IF I HAVE A POSITIVE PREGNANCY TEST? If you have a positive pregnancy test, schedule an appointment with your health care provider. You might need additional testing to confirm the pregnancy. In the meantime, begin taking a prenatal vitamin, stop smoking, stop drinking alcohol, and do not use street drugs. Talk to your health care provider about how to take care of yourself during your pregnancy. Ask about what to expect from the care you will need throughout pregnancy (prenatal care).   This information is not intended to replace advice given to you by your health care provider. Make sure you discuss any questions you have with your health care provider.   Document Released: 04/18/2003 Document Revised: 05/06/2014 Document Reviewed: 08/10/2013 Elsevier Interactive Patient Education 2016 [place]. Secondary Amenorrhea Secondary amenorrhea is the stopping of menstrual flow for 3-6 months in a female who has  previously had periods. There are many possible causes. Most of these causes are not serious. Usually, treating the underlying problem causing the loss of menses will return your periods to normal. CAUSES  Some common and uncommon causes of not menstruating include:  Malnutrition.  Low blood sugar (hypoglycemia).  Polycystic ovary disease.  Stress or  fear.  Breastfeeding.  Hormone imbalance.  Ovarian failure.  Medicines.  Extreme obesity.  Cystic fibrosis.  Low body weight or drastic weight reduction from any cause.  Early menopause.  Removal of ovaries or uterus.  Contraceptives.  Illness.  Long-term (chronic) illnesses.  Cushing syndrome.  Thyroid problems.  Birth control pills, patches, or vaginal rings for birth control. RISK FACTORS You may be at greater risk of secondary amenorrhea if:  You have a family history of this condition.  You have an eating disorder.  You do athletic training. DIAGNOSIS  A diagnosis is made by your health care provider taking a medical history and doing a physical exam. This will include a pelvic exam to check for problems with your reproductive organs. Pregnancy must be ruled out. Often, numerous blood tests are done to measure different hormones in the body. Urine testing may be done. Specialized exams (ultrasound, CT scan, MRI, or hysteroscopy) may have to be done as well as measuring the body mass index (BMI). TREATMENT  Treatment depends on the cause of the amenorrhea. If an eating disorder is present, this can be treated with an adequate diet and therapy. Chronic illnesses may improve with treatment of the illness. Amenorrhea may be corrected with medicines, lifestyle changes, or surgery. If the amenorrhea cannot be corrected, it is sometimes possible to create a false menstruation with medicines. HOME CARE INSTRUCTIONS  Maintain a healthy diet.  Manage weight problems.  Exercise regularly but not excessively.  Get adequate sleep.  Manage stress.  Be aware of changes in your menstrual cycle. Keep a record of when your periods occur. Note the date your period starts, how long it lasts, and any problems. SEEK MEDICAL CARE IF: Your symptoms do not get better with treatment.   This information is not intended to replace advice given to you by your health care provider.  Make sure you discuss any questions you have with your health care provider.   Document Released: 05/27/2006 Document Revised: 05/06/2014 Document Reviewed: 10/01/2012 Elsevier Interactive Patient Education [employer].

## 2015-07-11 NOTE — MAU Note (Addendum)
PT  SAYS LMP WAS 2-10-   SO SHE  SAYS SHE  IS  LAST   .  NO BIRTH CONTROL.   LAST SEX-   3-11.       X4 DAYS   HAS DONE  HPT-  ALL HAVE  FAINT  LINES.    FEELS  NAUSEA-   NO VOMITING.     FEELS CRAMPS- ON LEFT  SIDE- STARTED LAST WEEK ,     HAD PINK BLEEDING  ON 3-4.

## 2015-07-11 NOTE — MAU Provider Note (Signed)
History     CSN: [id number]  Arrival date and time: 07/11/15 2007   First Provider Initiated Contact with Patient 07/11/15 2131      Chief Complaint  Patient presents with  . Abdominal Cramping   HPI [name] is a 25 y.o. G0P0 who presents to MAU today with complaint of amenorrhea and possible pregnancy. The patient states intermittent cramping in the lower abdomen, more in the LLQ. She states +HPT and LMP of 06/09/15. She has had nausea without vomiting, diarrhea or constipation. She denies UTI symptoms or fever.   OB History    Gravida Para Term Preterm AB TAB SAB Ectopic Multiple Living   0        0       Past Medical History  Diagnosis Date  . Asthma   . ADHD (attention deficit hyperactivity disorder)   . Headache(784.0)   . Ulcer   . Urinary tract infection     Past Surgical History  Procedure Laterality Date  . Wisdom tooth extraction    . Myringotomy      Family History  Problem Relation Age of Onset  . Anesthesia problems Mother   . Asthma Father   . Heart disease Father   . Diabetes Father   . Hearing loss Father   . Heart disease Paternal Uncle   . Cancer Maternal Grandmother   . Heart disease Maternal Grandfather   . Diabetes Maternal Grandfather   . Asthma Brother     Social History  Substance Use Topics  . Smoking status: Never Smoker   . Smokeless tobacco: Never Used  . Alcohol Use: No    Allergies: No Known Allergies  No prescriptions prior to admission    Review of Systems  Constitutional: Negative for fever and malaise/fatigue.  Gastrointestinal: Positive for nausea and abdominal pain. Negative for vomiting, diarrhea and constipation.  Genitourinary: Negative for dysuria, urgency and frequency.       Neg - vaginal bleeding, discharge   Physical Exam   Blood pressure 136/82, pulse 87, temperature 98.7 F (37.1 C), temperature source Oral, resp. rate 20, height 5\' 5"  (1.651 m), weight 248 lb 6 oz (112.662 kg), last  menstrual period 06/09/2015, unknown if currently breastfeeding.  Physical Exam  Nursing note and vitals reviewed. Constitutional: She is oriented to person, place, and time. She appears well-developed and well-nourished. No distress.  HENT:  Head: Normocephalic and atraumatic.  Cardiovascular: Normal rate.   Respiratory: Effort normal.  GI: Soft. She exhibits no distension and no mass. There is no tenderness. There is no rebound and no guarding.  Genitourinary: Uterus is not enlarged and not tender. Cervix exhibits no motion tenderness, no discharge and no friability. Right adnexum displays no mass and no tenderness. Left adnexum displays no mass and no tenderness. No bleeding in the vagina. Vaginal discharge (scant mucus discharge noted) found.  Neurological: She is alert and oriented to person, place, and time.  Skin: Skin is warm and dry. No erythema.  Psychiatric: She has a normal mood and affect.    Results for orders placed or performed during the hospital encounter of 07/11/15 (from the past 24 hour(s))  Urinalysis, Routine w reflex microscopic (not at [facility])     Status: Abnormal   Collection Time: 07/11/15  8:30 PM  Result Value Ref Range   Color, Urine YELLOW YELLOW   APPearance CLEAR CLEAR   Specific Gravity, Urine 1.015 1.005 - 1.030   pH 7.5 5.0 - 8.0  Glucose, UA NEGATIVE NEGATIVE mg/dL   Hgb urine dipstick TRACE (A) NEGATIVE   Bilirubin Urine NEGATIVE NEGATIVE   Ketones, ur NEGATIVE NEGATIVE mg/dL   Protein, ur NEGATIVE NEGATIVE mg/dL   Nitrite NEGATIVE NEGATIVE   Leukocytes, UA NEGATIVE NEGATIVE  Urine microscopic-add on     Status: Abnormal   Collection Time: 07/11/15  8:30 PM  Result Value Ref Range   Squamous Epithelial / LPF 0-5 (A) NONE SEEN   WBC, UA NONE SEEN 0 - 5 WBC/hpf   RBC / HPF 0-5 0 - 5 RBC/hpf   Bacteria, UA RARE (A) NONE SEEN  Pregnancy, urine POC     Status: None   Collection Time: 07/11/15  9:17 PM  Result Value Ref Range   Preg Test, Ur  NEGATIVE NEGATIVE  hCG, quantitative, pregnancy     Status: None   Collection Time: 07/11/15  9:41 PM  Result Value Ref Range   hCG, Beta Chain, Quant, S <1 <5 mIU/mL  CBC     Status: None   Collection Time: 07/11/15  9:41 PM  Result Value Ref Range   WBC 9.5 4.0 - 10.5 K/uL   RBC 4.02 3.87 - 5.11 MIL/uL   Hemoglobin 12.4 12.0 - 15.0 g/dL   HCT 36.3 36.0 - 46.0 %   MCV 90.3 78.0 - 100.0 fL   MCH 30.8 26.0 - 34.0 pg   MCHC 34.2 30.0 - 36.0 g/dL   RDW 12.4 11.5 - 15.5 %   Platelets 298 150 - 400 K/uL  ABO/Rh     Status: None (Preliminary result)   Collection Time: 07/11/15  9:41 PM  Result Value Ref Range   ABO/RH(D) O POS   Wet prep, genital     Status: None   Collection Time: 07/11/15 10:10 PM  Result Value Ref Range   Yeast Wet Prep HPF POC NONE SEEN NONE SEEN   Trich, Wet Prep NONE SEEN NONE SEEN   Clue Cells Wet Prep HPF POC NONE SEEN NONE SEEN   WBC, Wet Prep HPF POC NONE SEEN NONE SEEN   Sperm NONE SEEN     MAU Course  Procedures None  MDM UPT - negative Since patient claims multiple faint positives at home, hCG ordered with CBC and ABO/Rh Discussed with Dr. Marvel Plan. Agrees with plan for discharge. Follow-up in the office for further evaluation.  Assessment and Plan  A: Negative pregnancy test Abdominal pain  P: Discharge home Ibuprofen PRN for pain Patient advised to follow-up with [facility] for further evaluation  Patient may return to MAU as needed or if her condition were to change or worsen   Luvenia Redden, PA-C  07/12/2015, 12:17 AM

## 2015-07-12 LAB — GC/CHLAMYDIA PROBE AMP (~~LOC~~) NOT AT ARMC
Chlamydia: NEGATIVE
Neisseria Gonorrhea: NEGATIVE

## 2015-07-12 LAB — ABO/RH: ABO/RH(D): O POS

## 2015-07-20 ENCOUNTER — Inpatient Hospital Stay (HOSPITAL_COMMUNITY)
Admission: AD | Admit: 2015-07-20 | Discharge: 2015-07-20 | Disposition: A | Discharge status: Home / Self Care | Payer: BLUE CROSS/BLUE SHIELD | Source: Ambulatory Visit | Attending: Obstetrics and Gynecology | Admitting: Obstetrics and Gynecology

## 2015-07-20 DIAGNOSIS — J45909 Unspecified asthma, uncomplicated: Secondary | ICD-10-CM | POA: Insufficient documentation

## 2015-07-20 DIAGNOSIS — N939 Abnormal uterine and vaginal bleeding, unspecified: Secondary | ICD-10-CM | POA: Diagnosis present

## 2015-07-20 DIAGNOSIS — R102 Pelvic and perineal pain: Secondary | ICD-10-CM | POA: Diagnosis not present

## 2015-07-20 DIAGNOSIS — F909 Attention-deficit hyperactivity disorder, unspecified type: Secondary | ICD-10-CM | POA: Insufficient documentation

## 2015-07-20 DIAGNOSIS — N926 Irregular menstruation, unspecified: Secondary | ICD-10-CM | POA: Insufficient documentation

## 2015-07-20 LAB — CBC
HCT: 35.9 % — ABNORMAL LOW (ref 36.0–46.0)
Hemoglobin: 12.1 g/dL (ref 12.0–15.0)
MCH: 30.4 pg (ref 26.0–34.0)
MCHC: 33.7 g/dL (ref 30.0–36.0)
MCV: 90.2 fL (ref 78.0–100.0)
PLATELETS: 280 10*3/uL (ref 150–400)
RBC: 3.98 MIL/uL (ref 3.87–5.11)
RDW: 12 % (ref 11.5–15.5)
WBC: 8.7 10*3/uL (ref 4.0–10.5)

## 2015-07-20 LAB — URINALYSIS, ROUTINE W REFLEX MICROSCOPIC
BILIRUBIN URINE: NEGATIVE
GLUCOSE, UA: NEGATIVE mg/dL
KETONES UR: NEGATIVE mg/dL
LEUKOCYTES UA: NEGATIVE
Nitrite: NEGATIVE
PROTEIN: NEGATIVE mg/dL
Specific Gravity, Urine: 1.025 (ref 1.005–1.030)
pH: 6 (ref 5.0–8.0)

## 2015-07-20 LAB — HCG, QUANTITATIVE, PREGNANCY: hCG, Beta Chain, Quant, S: <1 m[IU]/mL (ref <5)

## 2015-07-20 LAB — URINE MICROSCOPIC-ADD ON

## 2015-07-20 NOTE — Discharge Instructions (Signed)
Secondary Amenorrhea Secondary amenorrhea is the stopping of menstrual flow for 3-6 months in a female who has previously had periods. There are many possible causes. Most of these causes are not serious. Usually, treating the underlying problem causing the loss of menses will return your periods to normal. CAUSES  Some common and uncommon causes of not menstruating include:  Malnutrition.  Low blood sugar (hypoglycemia).  Polycystic ovary disease.  Stress or fear.  Breastfeeding.  Hormone imbalance.  Ovarian failure.  Medicines.  Extreme obesity.  Cystic fibrosis.  Low body weight or drastic weight reduction from any cause.  Early menopause.  Removal of ovaries or uterus.  Contraceptives.  Illness.  Long-term (chronic) illnesses.  Cushing syndrome.  Thyroid problems.  Birth control pills, patches, or vaginal rings for birth control. RISK FACTORS You may be at greater risk of secondary amenorrhea if:  You have a family history of this condition.  You have an eating disorder.  You do athletic training. DIAGNOSIS  A diagnosis is made by your health care provider taking a medical history and doing a physical exam. This will include a pelvic exam to check for problems with your reproductive organs. Pregnancy must be ruled out. Often, numerous blood tests are done to measure different hormones in the body. Urine testing may be done. Specialized exams (ultrasound, CT scan, MRI, or hysteroscopy) may have to be done as well as measuring the body mass index (BMI). TREATMENT  Treatment depends on the cause of the amenorrhea. If an eating disorder is present, this can be treated with an adequate diet and therapy. Chronic illnesses may improve with treatment of the illness. Amenorrhea may be corrected with medicines, lifestyle changes, or surgery. If the amenorrhea cannot be corrected, it is sometimes possible to create a false menstruation with medicines. HOME CARE  INSTRUCTIONS  Maintain a healthy diet.  Manage weight problems.  Exercise regularly but not excessively.  Get adequate sleep.  Manage stress.  Be aware of changes in your menstrual cycle. Keep a record of when your periods occur. Note the date your period starts, how long it lasts, and any problems. SEEK MEDICAL CARE IF: Your symptoms do not get better with treatment.   This information is not intended to replace advice given to you by your health care provider. Make sure you discuss any questions you have with your health care provider.   Document Released: 05/27/2006 Document Revised: 05/06/2014 Document Reviewed: 10/01/2012 Elsevier Interactive Patient Education 2016 Elsevier Inc.  

## 2015-07-20 NOTE — MAU Note (Signed)
Pt states she had a positive home preg test yesterday and she is having cramping on her left side. Had one spot of bleeding earlier but none since.

## 2015-07-20 NOTE — MAU Provider Note (Signed)
History     CSN: [id number]  Arrival date and time: 07/20/15 0019   First Provider Initiated Contact with Patient 07/20/15 0245      Chief Complaint  Patient presents with  . Vaginal Bleeding   Vaginal Bleeding The patient's primary symptoms include pelvic pain and vaginal bleeding. This is a new problem. The current episode started yesterday. The problem occurs intermittently. Pain severity now: 5/10  The problem affects the left side. She is not pregnant. The vaginal discharge was bloody. The vaginal bleeding is spotting. She has not been passing clots. She has not been passing tissue. Nothing aggravates the symptoms. She has tried nothing for the symptoms. She is sexually active. She uses nothing for contraception. Her menstrual history has been regular (LMp 06/09/15 ).    Past Medical History  Diagnosis Date  . Asthma   . ADHD (attention deficit hyperactivity disorder)   . Headache(784.0)   . Ulcer   . Urinary tract infection     Past Surgical History  Procedure Laterality Date  . Wisdom tooth extraction    . Myringotomy      Family History  Problem Relation Age of Onset  . Anesthesia problems Mother   . Asthma Father   . Heart disease Father   . Diabetes Father   . Hearing loss Father   . Heart disease Paternal Uncle   . Cancer Maternal Grandmother   . Heart disease Maternal Grandfather   . Diabetes Maternal Grandfather   . Asthma Brother     Social History  Substance Use Topics  . Smoking status: Never Smoker   . Smokeless tobacco: Never Used  . Alcohol Use: No    Allergies: No Known Allergies  Prescriptions prior to admission  Medication Sig Dispense Refill Last Dose  . ibuprofen (ADVIL,MOTRIN) 200 MG tablet Take 600 mg by mouth every 6 (six) hours as needed for moderate pain.    Past Month at Unknown time  . Prenatal Vit-Fe Fumarate-FA (PRENATAL MULTIVITAMIN) TABS tablet Take 1 tablet by mouth daily at 12 noon.   Past Week at Unknown time    Review  of Systems  Genitourinary: Positive for vaginal bleeding and pelvic pain.   Physical Exam   Blood pressure 135/98, pulse 79, temperature 98.4 F (36.9 C), temperature source Oral, resp. rate 18, height 5\' 6"  (1.676 m), weight 112.946 kg (249 lb), last menstrual period 06/09/2015, SpO2 100 %, unknown if currently breastfeeding.  Physical Exam  Nursing note and vitals reviewed. Constitutional: She is oriented to person, place, and time. She appears well-developed and well-nourished. No distress.  HENT:  Head: Normocephalic.  Cardiovascular: Normal rate.   Respiratory: Effort normal.  Musculoskeletal: Normal range of motion.  Neurological: She is alert and oriented to person, place, and time.  Skin: Skin is warm and dry.  Psychiatric: She has a normal mood and affect.     Results for orders placed or performed during the hospital encounter of 07/20/15 (from the past 24 hour(s))  Urinalysis, Routine w reflex microscopic (not at [facility])     Status: Abnormal   Collection Time: 07/20/15 12:54 AM  Result Value Ref Range   Color, Urine YELLOW YELLOW   APPearance CLEAR CLEAR   Specific Gravity, Urine 1.025 1.005 - 1.030   pH 6.0 5.0 - 8.0   Glucose, UA NEGATIVE NEGATIVE mg/dL   Hgb urine dipstick SMALL (A) NEGATIVE   Bilirubin Urine NEGATIVE NEGATIVE   Ketones, ur NEGATIVE NEGATIVE mg/dL   Protein, ur NEGATIVE  NEGATIVE mg/dL   Nitrite NEGATIVE NEGATIVE   Leukocytes, UA NEGATIVE NEGATIVE  Urine microscopic-add on     Status: Abnormal   Collection Time: 07/20/15 12:54 AM  Result Value Ref Range   Squamous Epithelial / LPF 0-5 (A) NONE SEEN   WBC, UA 0-5 0 - 5 WBC/hpf   RBC / HPF 0-5 0 - 5 RBC/hpf   Bacteria, UA RARE (A) NONE SEEN  hCG, quantitative, pregnancy     Status: None   Collection Time: 07/20/15  1:25 AM  Result Value Ref Range   hCG, Beta Chain, Quant, S <1 <5 mIU/mL  CBC     Status: Abnormal   Collection Time: 07/20/15  1:29 AM  Result Value Ref Range   WBC 8.7 4.0 -  10.5 K/uL   RBC 3.98 3.87 - 5.11 MIL/uL   Hemoglobin 12.1 12.0 - 15.0 g/dL   HCT 35.9 (L) 36.0 - 46.0 %   MCV 90.2 78.0 - 100.0 fL   MCH 30.4 26.0 - 34.0 pg   MCHC 33.7 30.0 - 36.0 g/dL   RDW 12.0 11.5 - 15.5 %   Platelets 280 150 - 400 K/uL    MAU Course  Procedures  MDM  Assessment and Plan   1. Irregular menstrual cycle    DC home Comfort measures reviewed  RX: none  Return to MAU as needed   Follow-up Information    Follow up with Logan Bores, MD.   Specialty:  Obstetrics and Gynecology   Why:  As scheduled   Contact information:   [address] [place] [address] [phone]         Mathis Bud 07/20/2015, 2:47 AM

## 2015-10-07 ENCOUNTER — Inpatient Hospital Stay (HOSPITAL_COMMUNITY): Payer: Self-pay

## 2015-10-07 ENCOUNTER — Encounter (HOSPITAL_COMMUNITY): Payer: Self-pay | Admitting: *Deleted

## 2015-10-07 ENCOUNTER — Inpatient Hospital Stay (HOSPITAL_COMMUNITY)
Admission: AD | Admit: 2015-10-07 | Discharge: 2015-10-07 | Disposition: A | Discharge status: Home / Self Care | Payer: Self-pay | Source: Ambulatory Visit | Attending: Obstetrics and Gynecology | Admitting: Obstetrics and Gynecology

## 2015-10-07 DIAGNOSIS — D252 Subserosal leiomyoma of uterus: Secondary | ICD-10-CM

## 2015-10-07 DIAGNOSIS — R102 Pelvic and perineal pain: Secondary | ICD-10-CM | POA: Insufficient documentation

## 2015-10-07 DIAGNOSIS — D259 Leiomyoma of uterus, unspecified: Secondary | ICD-10-CM | POA: Insufficient documentation

## 2015-10-07 HISTORY — DX: Methicillin resistant Staphylococcus aureus infection, unspecified site: A49.02

## 2015-10-07 LAB — WET PREP, GENITAL
Clue Cells Wet Prep HPF POC: NONE SEEN
Sperm: NONE SEEN
TRICH WET PREP: NONE SEEN
YEAST WET PREP: NONE SEEN

## 2015-10-07 LAB — URINE MICROSCOPIC-ADD ON: BACTERIA UA: NONE SEEN

## 2015-10-07 LAB — URINALYSIS, ROUTINE W REFLEX MICROSCOPIC
Bilirubin Urine: NEGATIVE
GLUCOSE, UA: NEGATIVE mg/dL
KETONES UR: NEGATIVE mg/dL
LEUKOCYTES UA: NEGATIVE
NITRITE: NEGATIVE
PROTEIN: NEGATIVE mg/dL
Specific Gravity, Urine: 1.015 (ref 1.005–1.030)
pH: 6.5 (ref 5.0–8.0)

## 2015-10-07 LAB — POCT PREGNANCY, URINE: Preg Test, Ur: NEGATIVE

## 2015-10-07 NOTE — MAU Provider Note (Signed)
History     CSN: [id number]  Arrival date and time: 10/07/15 1641   First Provider Initiated Contact with Patient 10/07/15 1713      Chief Complaint  Patient presents with  . Abdominal Pain   HPI Comments: G0, non-pregnant female c/o LLQ pain since yesterday. Symptoms aggravated by movement and palpation. She describes pain as pulling and pressure. Also had episode of dyspareunia today. Denies urinary sx. Denies fever. No vaginal discharge although she had 2 days of spotting this month. No N/V/D. No new partner and she is trying to conceive.    Pertinent Gynecological History: Menses: LMP 09/02/15, irregular q32 days Contraception: none and attempting conception Sexually transmitted diseases: no past history   Past Medical History  Diagnosis Date  . Asthma   . ADHD (attention deficit hyperactivity disorder)   . Headache(784.0)   . Ulcer   . Urinary tract infection   . MRSA infection 2012    rt thigh    Past Surgical History  Procedure Laterality Date  . Wisdom tooth extraction    . Myringotomy      Family History  Problem Relation Age of Onset  . Anesthesia problems Mother   . Heart disease Paternal Uncle   . Cancer Maternal Grandmother   . Heart disease Maternal Grandfather   . Diabetes Maternal Grandfather     leg amputee  . Asthma Brother   . Asthma Maternal Uncle   . Diabetes Maternal Uncle   . Hearing loss Maternal Uncle   . Heart disease Maternal Uncle   . Hypertension Maternal Uncle     Social History  Substance Use Topics  . Smoking status: Current Some Day Smoker    Types: Cigarettes  . Smokeless tobacco: Never Used     Comment: couple a month, rare hookah  . Alcohol Use: Yes     Comment: rare    Allergies: No Known Allergies  Prescriptions prior to admission  Medication Sig Dispense Refill Last Dose  . ibuprofen (ADVIL,MOTRIN) 200 MG tablet Take 600 mg by mouth every 6 (six) hours as needed for moderate pain.    Past Month at Unknown time   . Prenatal Vit-Fe Fumarate-FA (PRENATAL MULTIVITAMIN) TABS tablet Take 1 tablet by mouth daily at 12 noon.   Past Week at Unknown time    Review of Systems  Constitutional: Negative.   Respiratory: Negative.   Cardiovascular: Negative.   Gastrointestinal: Positive for abdominal pain.  Genitourinary: Negative.   Neurological: Negative.    Physical Exam   Blood pressure 138/82, pulse 95, temperature 97.9 F (36.6 C), temperature source Oral, resp. rate 18, height 5\' 6"  (1.676 m), last menstrual period 09/02/2015, unknown if currently breastfeeding.  Physical Exam  Constitutional: She appears well-developed and well-nourished.  HENT:  Head: Normocephalic and atraumatic.  Neck: Normal range of motion. Neck supple.  Cardiovascular: Normal rate and regular rhythm.   Respiratory: Effort normal and breath sounds normal.  GI: Soft. She exhibits no distension and no mass. There is tenderness (suprapubic). There is rebound. There is no guarding.  Genitourinary:  External: no lesions Vagina: rugated, nulli, cervix nml, thin white discharge Uterus: small, mobile, midline, anteverted, moderately tender Adnexae: no masses palpated, Lt tenderness noted, +CMT    Results for orders placed or performed during the hospital encounter of 10/07/15 (from the past 24 hour(s))  Urinalysis, Routine w reflex microscopic (not at [facility])     Status: Abnormal   Collection Time: 10/07/15  4:50 PM  Result Value Ref  Range   Color, Urine YELLOW YELLOW   APPearance CLEAR CLEAR   Specific Gravity, Urine 1.015 1.005 - 1.030   pH 6.5 5.0 - 8.0   Glucose, UA NEGATIVE NEGATIVE mg/dL   Hgb urine dipstick SMALL (A) NEGATIVE   Bilirubin Urine NEGATIVE NEGATIVE   Ketones, ur NEGATIVE NEGATIVE mg/dL   Protein, ur NEGATIVE NEGATIVE mg/dL   Nitrite NEGATIVE NEGATIVE   Leukocytes, UA NEGATIVE NEGATIVE  Urine microscopic-add on     Status: Abnormal   Collection Time: 10/07/15  4:50 PM  Result Value Ref Range    Squamous Epithelial / LPF 0-5 (A) NONE SEEN   WBC, UA 0-5 0 - 5 WBC/hpf   RBC / HPF 0-5 0 - 5 RBC/hpf   Bacteria, UA NONE SEEN NONE SEEN  Pregnancy, urine POC     Status: None   Collection Time: 10/07/15  4:53 PM  Result Value Ref Range   Preg Test, Ur NEGATIVE NEGATIVE   US Transvaginal Non-ob  10/07/2015  CLINICAL DATA:  25 year old female with pelvic pain. EXAM: TRANSABDOMINAL AND TRANSVAGINAL ULTRASOUND OF PELVIS TECHNIQUE: Both transabdominal and transvaginal ultrasound examinations of the pelvis were performed. Transabdominal technique was performed for global imaging of the pelvis including uterus, ovaries, adnexal regions, and pelvic cul-de-sac. It was necessary to proceed with endovaginal exam following the transabdominal exam to visualize the endometrium and the ovaries. COMPARISON:  None FINDINGS: Uterus The uterus is anteverted and measures 8.4 x 3.8 x 4.8 cm. There is a 1.8 x 1.8 x 1.7 cm somewhat exophytic hypoechoic structure along the posterior uterine body likely a sub serosal fibroid. Endometrium The endometrium is thickened measuring 15 mm. Mobile appearing echogenic content within the endometrium likely represent blood product. Right ovary Measurements: 3.2 x 2.5 x 2.2 cm. Normal appearance/no adnexal mass. Left ovary Measurements: 4.4 x 1.7 x 1.8 cm. Normal appearance/no adnexal mass. Other findings Trace free fluid noted within the pelvis. IMPRESSION: Small posterior uterine sub serosal fibroid. Thickened endometrium likely containing small amount of blood product. Unremarkable ovaries. Electronically Signed   By: Anner Crete M.D.   On: 10/07/2015 18:59   US Pelvis Complete  10/07/2015  CLINICAL DATA:  25 year old female with pelvic pain. EXAM: TRANSABDOMINAL AND TRANSVAGINAL ULTRASOUND OF PELVIS TECHNIQUE: Both transabdominal and transvaginal ultrasound examinations of the pelvis were performed. Transabdominal technique was performed for global imaging of the pelvis including  uterus, ovaries, adnexal regions, and pelvic cul-de-sac. It was necessary to proceed with endovaginal exam following the transabdominal exam to visualize the endometrium and the ovaries. COMPARISON:  None FINDINGS: Uterus The uterus is anteverted and measures 8.4 x 3.8 x 4.8 cm. There is a 1.8 x 1.8 x 1.7 cm somewhat exophytic hypoechoic structure along the posterior uterine body likely a sub serosal fibroid. Endometrium The endometrium is thickened measuring 15 mm. Mobile appearing echogenic content within the endometrium likely represent blood product. Right ovary Measurements: 3.2 x 2.5 x 2.2 cm. Normal appearance/no adnexal mass. Left ovary Measurements: 4.4 x 1.7 x 1.8 cm. Normal appearance/no adnexal mass. Other findings Trace free fluid noted within the pelvis. IMPRESSION: Small posterior uterine sub serosal fibroid. Thickened endometrium likely containing small amount of blood product. Unremarkable ovaries. Electronically Signed   By: Anner Crete M.D.   On: 10/07/2015 18:59    MAU Course  Procedures  MDM Sono, wet prep, UA. No evidence of acute abdomen, ovarian cyst, or torsion. Dr. Marvel Plan notified of A/P, agrees. Stable for discharge home.  Assessment and Plan  Pelvic  pain  Uterine fibroid  Discharge home Ibuprofen OTC prn pain Follow up at [facility] as needed for pain or of no menses in 1 month  Julianne Handler 10/07/2015, 5:17 PM

## 2015-10-07 NOTE — Discharge Instructions (Signed)
Uterine Fibroids Uterine fibroids are tissue masses (tumors) that can develop in the womb (uterus). They are also called leiomyomas. This type of tumor is not cancerous (benign) and does not spread to other parts of the body outside of the pelvic area, which is between the hip bones. Occasionally, fibroids may develop in the fallopian tubes, in the cervix, or on the support structures (ligaments) that surround the uterus. You can have one or many fibroids. Fibroids can vary in size, weight, and where they grow in the uterus. Some can become quite large. Most fibroids do not require medical treatment. CAUSES A fibroid can develop when a single uterine cell keeps growing (replicating). Most cells in the human body have a control mechanism that keeps them from replicating without control. SIGNS AND SYMPTOMS Symptoms may include:   Heavy bleeding during your period.  Bleeding or spotting between periods.  Pelvic pain and pressure.  Bladder problems, such as needing to urinate more often (urinary frequency) or urgently.  Inability to reproduce offspring (infertility).  Miscarriages. DIAGNOSIS Uterine fibroids are diagnosed through a physical exam. Your health care provider may feel the lumpy tumors during a pelvic exam. Ultrasonography and an MRI may be done to determine the size, location, and number of fibroids. TREATMENT Treatment may include:  Watchful waiting. This involves getting the fibroid checked by your health care provider to see if it grows or shrinks. Follow your health care provider's recommendations for how often to have this checked.  Hormone medicines. These can be taken by mouth or given through an intrauterine device (IUD).  Surgery.  Removing the fibroids (myomectomy) or the uterus (hysterectomy).  Removing blood supply to the fibroids (uterine artery embolization). If fibroids interfere with your fertility and you want to become pregnant, your health care provider  may recommend having the fibroids removed.  HOME CARE INSTRUCTIONS  Keep all follow-up visits as directed by your health care provider. This is important.  Take medicines only as directed by your health care provider.  If you were prescribed a hormone treatment, take the hormone medicines exactly as directed.  Do not take aspirin, because it can cause bleeding.  Ask your health care provider about taking iron pills and increasing the amount of dark green, leafy vegetables in your diet. These actions can help to boost your blood iron levels, which may be affected by heavy menstrual bleeding.  Pay close attention to your period and tell your health care provider about any changes, such as:  Increased blood flow that requires you to use more pads or tampons than usual per month.  A change in the number of days that your period lasts per month.  A change in symptoms that are associated with your period, such as abdominal cramping or back pain. SEEK MEDICAL CARE IF:  You have pelvic pain, back pain, or abdominal cramps that cannot be controlled with medicines.  You have an increase in bleeding between and during periods.  You soak tampons or pads in a half hour or less.  You feel lightheaded, extra tired, or weak. SEEK IMMEDIATE MEDICAL CARE IF:  You faint.  You have a sudden increase in pelvic pain.   This information is not intended to replace advice given to you by your health care provider. Make sure you discuss any questions you have with your health care provider.   Document Released: 04/12/2000 Document Revised: 05/06/2014 Document Reviewed: 10/12/2013 Elsevier Interactive Patient Education 2016 [employer] Pelvic Pain, Female Female pelvic pain can  be caused by many different things and start from a variety of places. Pelvic pain refers to pain that is located in the lower half of the abdomen and between your hips. The pain may occur over a short period of time (acute)  or may be reoccurring (chronic). The cause of pelvic pain may be related to disorders affecting the female reproductive organs (gynecologic), but it may also be related to the bladder, kidney stones, an intestinal complication, or muscle or skeletal problems. Getting help right away for pelvic pain is important, especially if there has been severe, sharp, or a sudden onset of unusual pain. It is also important to get help right away because some types of pelvic pain can be life threatening.  CAUSES  Below are only some of the causes of pelvic pain. The causes of pelvic pain can be in one of several categories.   Gynecologic.  Pelvic inflammatory disease.  Sexually transmitted infection.  Ovarian cyst or a twisted ovarian ligament (ovarian torsion).  Uterine lining that grows outside the uterus (endometriosis).  Fibroids, cysts, or tumors.  Ovulation.  Pregnancy.  Pregnancy that occurs outside the uterus (ectopic pregnancy).  Miscarriage.  Labor.  Abruption of the placenta or ruptured uterus.  Infection.  Uterine infection (endometritis).  Bladder infection.  Diverticulitis.  Miscarriage related to a uterine infection (septic abortion).  Bladder.  Inflammation of the bladder (cystitis).  Kidney stone(s).  Gastrointestinal.  Constipation.  Diverticulitis.  Neurologic.  Trauma.  Feeling pelvic pain because of mental or emotional causes (psychosomatic).  Cancers of the bowel or pelvis. EVALUATION  Your caregiver will want to take a careful history of your concerns. This includes recent changes in your health, a careful gynecologic history of your periods (menses), and a sexual history. Obtaining your family history and medical history is also important. Your caregiver may suggest a pelvic exam. A pelvic exam will help identify the location and severity of the pain. It also helps in the evaluation of which organ system may be involved. In order to identify the  cause of the pelvic pain and be properly treated, your caregiver may order tests. These tests may include:   A pregnancy test.  Pelvic ultrasonography.  An X-ray exam of the abdomen.  A urinalysis or evaluation of vaginal discharge.  Blood tests. HOME CARE INSTRUCTIONS   Only take over-the-counter or prescription medicines for pain, discomfort, or fever as directed by your caregiver.   Rest as directed by your caregiver.   Eat a balanced diet.   Drink enough fluids to make your urine clear or pale yellow, or as directed.   Avoid sexual intercourse if it causes pain.   Apply warm or cold compresses to the lower abdomen depending on which one helps the pain.   Avoid stressful situations.   Keep a journal of your pelvic pain. Write down when it started, where the pain is located, and if there are things that seem to be associated with the pain, such as food or your menstrual cycle.  Follow up with your caregiver as directed.  SEEK MEDICAL CARE IF:  Your medicine does not help your pain.  You have abnormal vaginal discharge. SEEK IMMEDIATE MEDICAL CARE IF:   You have heavy bleeding from the vagina.   Your pelvic pain increases.   You feel light-headed or faint.   You have chills.   You have pain with urination or blood in your urine.   You have uncontrolled diarrhea or vomiting.   You  have a fever or persistent symptoms for more than 3 days.  You have a fever and your symptoms suddenly get worse.   You are being physically or sexually abused.   This information is not intended to replace advice given to you by your health care provider. Make sure you discuss any questions you have with your health care provider.   Document Released: 03/12/2004 Document Revised: 01/04/2015 Document Reviewed: 08/05/2011 Elsevier Interactive Patient Education [employer].

## 2015-10-07 NOTE — MAU Note (Signed)
Pressure/pain in LLQ. If pushes on rt side, can feel it- but still just on left.  Started yesterday.  Lower abd feels like she has been " working out", but she hasn't

## 2015-10-09 LAB — GC/CHLAMYDIA PROBE AMP (~~LOC~~) NOT AT ARMC
CHLAMYDIA, DNA PROBE: NEGATIVE
Neisseria Gonorrhea: NEGATIVE

## 2016-01-16 ENCOUNTER — Inpatient Hospital Stay (HOSPITAL_COMMUNITY)
Admission: AD | Admit: 2016-01-16 | Discharge: 2016-01-16 | Disposition: A | Discharge status: Home / Self Care | Payer: Self-pay | Source: Ambulatory Visit | Attending: Obstetrics and Gynecology | Admitting: Obstetrics and Gynecology

## 2016-01-16 ENCOUNTER — Encounter (HOSPITAL_COMMUNITY): Payer: Self-pay | Admitting: *Deleted

## 2016-01-16 DIAGNOSIS — N72 Inflammatory disease of cervix uteri: Secondary | ICD-10-CM | POA: Insufficient documentation

## 2016-01-16 DIAGNOSIS — D252 Subserosal leiomyoma of uterus: Secondary | ICD-10-CM | POA: Insufficient documentation

## 2016-01-16 DIAGNOSIS — A499 Bacterial infection, unspecified: Secondary | ICD-10-CM

## 2016-01-16 DIAGNOSIS — B9689 Other specified bacterial agents as the cause of diseases classified elsewhere: Secondary | ICD-10-CM

## 2016-01-16 DIAGNOSIS — R1032 Left lower quadrant pain: Secondary | ICD-10-CM | POA: Insufficient documentation

## 2016-01-16 DIAGNOSIS — N760 Acute vaginitis: Secondary | ICD-10-CM | POA: Insufficient documentation

## 2016-01-16 DIAGNOSIS — E282 Polycystic ovarian syndrome: Secondary | ICD-10-CM | POA: Insufficient documentation

## 2016-01-16 DIAGNOSIS — F17210 Nicotine dependence, cigarettes, uncomplicated: Secondary | ICD-10-CM | POA: Insufficient documentation

## 2016-01-16 DIAGNOSIS — N926 Irregular menstruation, unspecified: Secondary | ICD-10-CM | POA: Insufficient documentation

## 2016-01-16 HISTORY — DX: Polycystic ovarian syndrome: E28.2

## 2016-01-16 HISTORY — DX: Benign neoplasm of connective and other soft tissue, unspecified: D21.9

## 2016-01-16 LAB — URINALYSIS, ROUTINE W REFLEX MICROSCOPIC
BILIRUBIN URINE: NEGATIVE
Glucose, UA: NEGATIVE mg/dL
Ketones, ur: NEGATIVE mg/dL
Leukocytes, UA: NEGATIVE
NITRITE: NEGATIVE
PROTEIN: NEGATIVE mg/dL
Specific Gravity, Urine: 1.01 (ref 1.005–1.030)
pH: 7 (ref 5.0–8.0)

## 2016-01-16 LAB — WET PREP, GENITAL
SPERM: NONE SEEN
Trich, Wet Prep: NONE SEEN
Yeast Wet Prep HPF POC: NONE SEEN

## 2016-01-16 LAB — URINE MICROSCOPIC-ADD ON

## 2016-01-16 LAB — POCT PREGNANCY, URINE: PREG TEST UR: NEGATIVE

## 2016-01-16 MED ORDER — KETOROLAC TROMETHAMINE 60 MG/2ML IM SOLN
60.0000 mg | Freq: Once | INTRAMUSCULAR | Status: AC
  Administered 2016-01-16: 60 mg via INTRAMUSCULAR
  Filled 2016-01-16: qty 2

## 2016-01-16 MED ORDER — METRONIDAZOLE 500 MG PO TABS: 500.0000 mg | ORAL_TABLET | Freq: Two times a day (BID) | ORAL | 0 refills | Status: DC

## 2016-01-16 MED ORDER — AZITHROMYCIN 250 MG PO TABS
1000.0000 mg | ORAL_TABLET | Freq: Once | ORAL | Status: AC
  Administered 2016-01-16: 1000 mg via ORAL
  Filled 2016-01-16: qty 4

## 2016-01-16 MED ORDER — CEFTRIAXONE SODIUM 250 MG IJ SOLR
250.0000 mg | Freq: Once | INTRAMUSCULAR | Status: AC
  Administered 2016-01-16: 250 mg via INTRAMUSCULAR
  Filled 2016-01-16: qty 250

## 2016-01-16 NOTE — Discharge Instructions (Signed)
Bacterial Vaginosis Bacterial vaginosis is a vaginal infection that occurs when the normal balance of bacteria in the vagina is disrupted. It results from an overgrowth of certain bacteria. This is the most common vaginal infection in women of childbearing age. Treatment is important to prevent complications, especially in pregnant women, as it can cause a premature delivery. CAUSES  Bacterial vaginosis is caused by an increase in harmful bacteria that are normally present in smaller amounts in the vagina. Several different kinds of bacteria can cause bacterial vaginosis. However, the reason that the condition develops is not fully understood. RISK FACTORS Certain activities or behaviors can put you at an increased risk of developing bacterial vaginosis, including:  Having a new sex partner or multiple sex partners.  Douching.  Using an intrauterine device (IUD) for contraception. Women do not get bacterial vaginosis from toilet seats, bedding, swimming pools, or contact with objects around them. SIGNS AND SYMPTOMS  Some women with bacterial vaginosis have no signs or symptoms. Common symptoms include:  Grey vaginal discharge.  A fishlike odor with discharge, especially after sexual intercourse.  Itching or burning of the vagina and vulva.  Burning or pain with urination. DIAGNOSIS  Your health care provider will take a medical history and examine the vagina for signs of bacterial vaginosis. A sample of vaginal fluid may be taken. Your health care provider will look at this sample under a microscope to check for bacteria and abnormal cells. A vaginal pH test may also be done.  TREATMENT  Bacterial vaginosis may be treated with antibiotic medicines. These may be given in the form of a pill or a vaginal cream. A second round of antibiotics may be prescribed if the condition comes back after treatment. Because bacterial vaginosis increases your risk for sexually transmitted diseases, getting  treated can help reduce your risk for chlamydia, gonorrhea, HIV, and herpes. HOME CARE INSTRUCTIONS   Only take over-the-counter or prescription medicines as directed by your health care provider.  If antibiotic medicine was prescribed, take it as directed. Make sure you finish it even if you start to feel better.  Tell all sexual partners that you have a vaginal infection. They should see their health care provider and be treated if they have problems, such as a mild rash or itching.  During treatment, it is important that you follow these instructions:  Avoid sexual activity or use condoms correctly.  Do not douche.  Avoid alcohol as directed by your health care provider.  Avoid breastfeeding as directed by your health care provider. SEEK MEDICAL CARE IF:   Your symptoms are not improving after 3 days of treatment.  You have increased discharge or pain.  You have a fever. MAKE SURE YOU:   Understand these instructions.  Will watch your condition.  Will get help right away if you are not doing well or get worse. FOR MORE INFORMATION  Centers for Disease Control and Prevention, Division of STD Prevention: Appraiser[employer]raud[employer] American Sexual Health Association (ASHA): www.ashastd.org    This information is not intended to replace advice given to you by your health care provider. Make sure you discuss any questions you have with your health care provider.   Document Released: 04/15/2005 Document Revised: 05/06/2014 Document Reviewed: 11/25/2012 Elsevier Interactive Patient Education 2016 Elsevier Inc.   Cervicitis Cervicitis is a soreness and swelling (inflammation) of the cervix. Your cervix is located at the bottom of your uterus. It opens up to the vagina. CAUSES   Sexually transmitted infections (STIs).  Allergic reaction.   Medicines or birth control devices that are put in the vagina.   Injury to the cervix.   Bacterial infections.  RISK FACTORS You are  at greater risk if you:  Have unprotected sexual intercourse.  Have sexual intercourse with many partners.  Began sexual intercourse at an early age.  Have a history of STIs. SYMPTOMS  There may be no symptoms. If symptoms occur, they may include:   Gray, white, yellow, or bad-smelling vaginal discharge.   Pain or itching of the area outside the vagina.   Painful sexual intercourse.   Lower abdominal or lower back pain, especially during intercourse.   Frequent urination.   Abnormal vaginal bleeding between periods, after sexual intercourse, or after menopause.   Pressure or a heavy feeling in the pelvis.  DIAGNOSIS  Diagnosis is made after a pelvic exam. Other tests may include:   Examination of any discharge under a microscope (wet prep).   A Pap test.  TREATMENT  Treatment will depend on the cause of cervicitis. If it is caused by an STI, both you and your partner will need to be treated. Antibiotic medicines will be given.  HOME CARE INSTRUCTIONS   Do not have sexual intercourse until your health care provider says it is okay.   Do not have sexual intercourse until your partner has been treated, if your cervicitis is caused by an STI.   Take your antibiotics as directed. Finish them even if you start to feel better.  SEEK MEDICAL CARE IF:  Your symptoms come back.   You have a fever.  MAKE SURE YOU:   Understand these instructions.  Will watch your condition.  Will get help right away if you are not doing well or get worse.   This information is not intended to replace advice given to you by your health care provider. Make sure you discuss any questions you have with your health care provider.   Document Released: 04/15/2005 Document Revised: 04/20/2013 Document Reviewed: 10/07/2012 Elsevier Interactive Patient Education 2016 Elsevier Inc.   Bacterial Vaginosis Bacterial vaginosis is a vaginal infection that occurs when the normal balance  of bacteria in the vagina is disrupted. It results from an overgrowth of certain bacteria. This is the most common vaginal infection in women of childbearing age. Treatment is important to prevent complications, especially in pregnant women, as it can cause a premature delivery. CAUSES  Bacterial vaginosis is caused by an increase in harmful bacteria that are normally present in smaller amounts in the vagina. Several different kinds of bacteria can cause bacterial vaginosis. However, the reason that the condition develops is not fully understood. RISK FACTORS Certain activities or behaviors can put you at an increased risk of developing bacterial vaginosis, including:  Having a new sex partner or multiple sex partners.  Douching.  Using an intrauterine device (IUD) for contraception. Women do not get bacterial vaginosis from toilet seats, bedding, swimming pools, or contact with objects around them. SIGNS AND SYMPTOMS  Some women with bacterial vaginosis have no signs or symptoms. Common symptoms include:  Grey vaginal discharge.  A fishlike odor with discharge, especially after sexual intercourse.  Itching or burning of the vagina and vulva.  Burning or pain with urination. DIAGNOSIS  Your health care provider will take a medical history and examine the vagina for signs of bacterial vaginosis. A sample of vaginal fluid may be taken. Your health care provider will look at this sample under a microscope to check for  bacteria and abnormal cells. A vaginal pH test may also be done.  TREATMENT  Bacterial vaginosis may be treated with antibiotic medicines. These may be given in the form of a pill or a vaginal cream. A second round of antibiotics may be prescribed if the condition comes back after treatment. Because bacterial vaginosis increases your risk for sexually transmitted diseases, getting treated can help reduce your risk for chlamydia, gonorrhea, HIV, and herpes. HOME CARE INSTRUCTIONS     Only take over-the-counter or prescription medicines as directed by your health care provider.  If antibiotic medicine was prescribed, take it as directed. Make sure you finish it even if you start to feel better.  Tell all sexual partners that you have a vaginal infection. They should see their health care provider and be treated if they have problems, such as a mild rash or itching.  During treatment, it is important that you follow these instructions:  Avoid sexual activity or use condoms correctly.  Do not douche.  Avoid alcohol as directed by your health care provider.  Avoid breastfeeding as directed by your health care provider. SEEK MEDICAL CARE IF:   Your symptoms are not improving after 3 days of treatment.  You have increased discharge or pain.  You have a fever. MAKE SURE YOU:   Understand these instructions.  Will watch your condition.  Will get help right away if you are not doing well or get worse. FOR MORE INFORMATION  Centers for Disease Control and Prevention, Division of STD Prevention: Appraiser[employer]raud[employer] American Sexual Health Association (ASHA): www.ashastd.org    This information is not intended to replace advice given to you by your health care provider. Make sure you discuss any questions you have with your health care provider.   Document Released: 04/15/2005 Document Revised: 05/06/2014 Document Reviewed: 11/25/2012 Elsevier Interactive Patient Education 2016 Elsevier Inc.   Cervicitis Cervicitis is a soreness and swelling (inflammation) of the cervix. Your cervix is located at the bottom of your uterus. It opens up to the vagina. CAUSES   Sexually transmitted infections (STIs).   Allergic reaction.   Medicines or birth control devices that are put in the vagina.   Injury to the cervix.   Bacterial infections.  RISK FACTORS You are at greater risk if you:  Have unprotected sexual intercourse.  Have sexual intercourse with many  partners.  Began sexual intercourse at an early age.  Have a history of STIs. SYMPTOMS  There may be no symptoms. If symptoms occur, they may include:   Gray, white, yellow, or bad-smelling vaginal discharge.   Pain or itching of the area outside the vagina.   Painful sexual intercourse.   Lower abdominal or lower back pain, especially during intercourse.   Frequent urination.   Abnormal vaginal bleeding between periods, after sexual intercourse, or after menopause.   Pressure or a heavy feeling in the pelvis.  DIAGNOSIS  Diagnosis is made after a pelvic exam. Other tests may include:   Examination of any discharge under a microscope (wet prep).   A Pap test.  TREATMENT  Treatment will depend on the cause of cervicitis. If it is caused by an STI, both you and your partner will need to be treated. Antibiotic medicines will be given.  HOME CARE INSTRUCTIONS   Do not have sexual intercourse until your health care provider says it is okay.   Do not have sexual intercourse until your partner has been treated, if your cervicitis is caused by an STI.  Take your antibiotics as directed. Finish them even if you start to feel better.  SEEK MEDICAL CARE IF:  Your symptoms come back.   You have a fever.  MAKE SURE YOU:   Understand these instructions.  Will watch your condition.  Will get help right away if you are not doing well or get worse.   This information is not intended to replace advice given to you by your health care provider. Make sure you discuss any questions you have with your health care provider.   Document Released: 04/15/2005 Document Revised: 04/20/2013 Document Reviewed: 10/07/2012 Elsevier Interactive Patient Education [employer].

## 2016-01-16 NOTE — MAU Provider Note (Signed)
History     CSN: [id number]  Arrival date and time: 01/16/16 1558   None     Chief Complaint  Patient presents with  . Abdominal Pain   HPI Pt is not pregnant 25 y.o.G0P0 c/o LLQ.pt took 600mg  ibuprofen yesterday . Denies abnormal vaginal discharge.  Pt was seen in previous with noted 79mm subserosal fibroid.  Pt is not using anything for contraception. Pt desires pregnancy. Pt has not insurance- (has previously seen [employer]).  Pt has irregular periods and told she likely has PCOS. Pt denies fever, chills, nausea, vomiting, constipation or diarrhea.  Pt had some short lasting dyspareunia.   RN note: Pt has hx of fibroids, has been having LLQ pain for the last week, is getting worse.  Has some pain on R with BM's.  Has been intermittent brownish bleeding since 9/10.  Past Medical History:  Diagnosis Date  . ADHD (attention deficit hyperactivity disorder)   . Asthma   . Fibroid   . Headache(784.0)   . MRSA infection 2012   rt thigh  . Ulcer   . Urinary tract infection     Past Surgical History:  Procedure Laterality Date  . MYRINGOTOMY    . WISDOM TOOTH EXTRACTION      Family History  Problem Relation Age of Onset  . Anesthesia problems Mother   . Cancer Maternal Grandmother   . Heart disease Maternal Grandfather   . Diabetes Maternal Grandfather     leg amputee  . Heart disease Paternal Uncle   . Asthma Brother   . Asthma Maternal Uncle   . Diabetes Maternal Uncle   . Hearing loss Maternal Uncle   . Heart disease Maternal Uncle   . Hypertension Maternal Uncle     Social History  Substance Use Topics  . Smoking status: Current Some Day Smoker    Types: Cigarettes  . Smokeless tobacco: Never Used     Comment: couple a month, rare hookah  . Alcohol use Yes     Comment: rare    Allergies: No Known Allergies  Prescriptions Prior to Admission  Medication Sig Dispense Refill Last Dose  . ibuprofen (ADVIL,MOTRIN) 200 MG tablet Take 600 mg by mouth every 6  (six) hours as needed for moderate pain.    Past Month at Unknown time  . Prenatal Vit-Fe Fumarate-FA (PRENATAL MULTIVITAMIN) TABS tablet Take 1 tablet by mouth daily at 12 noon.    Past Month at Unknown time    Review of Systems  Constitutional: Negative for chills and fever.  Gastrointestinal: Positive for abdominal pain. Negative for constipation, diarrhea, nausea and vomiting.  Genitourinary: Negative for dysuria.   Physical Exam   Blood pressure 110/56, pulse 64, temperature 98.2 F (36.8 C), temperature source Oral, resp. rate 18, height 5\' 6"  (1.676 m), weight 252 lb (114.3 kg), last menstrual period 12/10/2015, unknown if currently breastfeeding.  Physical Exam  Nursing note and vitals reviewed. Constitutional: She is oriented to person, place, and time. She appears well-developed and well-nourished. No distress.  HENT:  Head: Normocephalic.  Eyes: Pupils are equal, round, and reactive to light.  Neck: Normal range of motion. Neck supple.  Cardiovascular: Normal rate.   Respiratory: Effort normal.  GI: Soft. She exhibits no distension. There is tenderness. There is no rebound and no guarding.  Genitourinary:  Genitourinary Comments: Small amount of opaque pink tinged discharge in vault; cervix nullip; +CMT; uterus retroverted, bimanual diffusely tender without any appreciable enlargement.  No rebound.  Musculoskeletal: Normal range of motion.  Neurological: She is alert and oriented to person, place, and time.  Skin: Skin is warm.  Psychiatric: She has a normal mood and affect.    MAU Course  Procedures Results for orders placed or performed during the hospital encounter of 01/16/16 (from the past 24 hour(s))  Urinalysis, Routine w reflex microscopic (not at [facility])     Status: Abnormal   Collection Time: 01/16/16  4:37 PM  Result Value Ref Range   Color, Urine YELLOW YELLOW   APPearance CLEAR CLEAR   Specific Gravity, Urine 1.010 1.005 - 1.030   pH 7.0 5.0 - 8.0    Glucose, UA NEGATIVE NEGATIVE mg/dL   Hgb urine dipstick TRACE (A) NEGATIVE   Bilirubin Urine NEGATIVE NEGATIVE   Ketones, ur NEGATIVE NEGATIVE mg/dL   Protein, ur NEGATIVE NEGATIVE mg/dL   Nitrite NEGATIVE NEGATIVE   Leukocytes, UA NEGATIVE NEGATIVE  Urine microscopic-add on     Status: Abnormal   Collection Time: 01/16/16  4:37 PM  Result Value Ref Range   Squamous Epithelial / LPF 0-5 (A) NONE SEEN   WBC, UA 0-5 0 - 5 WBC/hpf   RBC / HPF 0-5 0 - 5 RBC/hpf   Bacteria, UA RARE (A) NONE SEEN  Pregnancy, urine POC     Status: None   Collection Time: 01/16/16  4:51 PM  Result Value Ref Range   Preg Test, Ur NEGATIVE NEGATIVE  GC/chlamydia pending Toradol 60mg  IM given for pain- pain resolved Rocephin 250mg  IM and Zithromax 1 gm PO for cervicitis Assessment and Plan  Cervicitis  Bacterial vaginosis- Rx Flagyl 500mg  BID for 7 days Abdominal pain - Ibuprofen at home Irregular periods F/u with [facility] Information on PCOS given  Isamar Wellbrock 01/16/2016, 5:45 PM

## 2016-01-16 NOTE — MAU Note (Signed)
Pt has hx of fibroids, has been having LLQ pain for the last week, is getting worse.  Has some pain on R with BM's.  Has been intermittent brownish bleeding since 9/10.

## 2016-01-17 LAB — GC/CHLAMYDIA PROBE AMP (~~LOC~~) NOT AT ARMC
CHLAMYDIA, DNA PROBE: NEGATIVE
Neisseria Gonorrhea: NEGATIVE

## 2016-05-20 ENCOUNTER — Encounter (HOSPITAL_COMMUNITY): Payer: Self-pay | Admitting: *Deleted

## 2016-05-20 ENCOUNTER — Inpatient Hospital Stay (HOSPITAL_COMMUNITY)
Admission: AD | Admit: 2016-05-20 | Discharge: 2016-05-20 | Disposition: A | Discharge status: Home / Self Care | Payer: Self-pay | Source: Ambulatory Visit | Attending: Obstetrics & Gynecology | Admitting: Obstetrics & Gynecology

## 2016-05-20 DIAGNOSIS — N939 Abnormal uterine and vaginal bleeding, unspecified: Secondary | ICD-10-CM

## 2016-05-20 DIAGNOSIS — J45909 Unspecified asthma, uncomplicated: Secondary | ICD-10-CM | POA: Insufficient documentation

## 2016-05-20 DIAGNOSIS — Z87891 Personal history of nicotine dependence: Secondary | ICD-10-CM | POA: Insufficient documentation

## 2016-05-20 DIAGNOSIS — D252 Subserosal leiomyoma of uterus: Secondary | ICD-10-CM | POA: Insufficient documentation

## 2016-05-20 DIAGNOSIS — N946 Dysmenorrhea, unspecified: Secondary | ICD-10-CM | POA: Insufficient documentation

## 2016-05-20 DIAGNOSIS — E282 Polycystic ovarian syndrome: Secondary | ICD-10-CM | POA: Insufficient documentation

## 2016-05-20 DIAGNOSIS — A4902 Methicillin resistant Staphylococcus aureus infection, unspecified site: Secondary | ICD-10-CM | POA: Insufficient documentation

## 2016-05-20 DIAGNOSIS — F909 Attention-deficit hyperactivity disorder, unspecified type: Secondary | ICD-10-CM | POA: Insufficient documentation

## 2016-05-20 DIAGNOSIS — Z87440 Personal history of urinary (tract) infections: Secondary | ICD-10-CM | POA: Insufficient documentation

## 2016-05-20 LAB — URINALYSIS, ROUTINE W REFLEX MICROSCOPIC
Bacteria, UA: NONE SEEN
Bilirubin Urine: NEGATIVE
Glucose, UA: NEGATIVE mg/dL
KETONES UR: NEGATIVE mg/dL
Leukocytes, UA: NEGATIVE
Nitrite: NEGATIVE
PROTEIN: 30 mg/dL — AB
Specific Gravity, Urine: 1.025 (ref 1.005–1.030)
pH: 5 (ref 5.0–8.0)

## 2016-05-20 LAB — WET PREP, GENITAL
CLUE CELLS WET PREP: NONE SEEN
Sperm: NONE SEEN
TRICH WET PREP: NONE SEEN
WBC WET PREP: NONE SEEN
Yeast Wet Prep HPF POC: NONE SEEN

## 2016-05-20 LAB — CBC
HCT: 32.9 % — ABNORMAL LOW (ref 36.0–46.0)
Hemoglobin: 11.3 g/dL — ABNORMAL LOW (ref 12.0–15.0)
MCH: 30.3 pg (ref 26.0–34.0)
MCHC: 34.3 g/dL (ref 30.0–36.0)
MCV: 88.2 fL (ref 78.0–100.0)
Platelets: 301 10*3/uL (ref 150–400)
RBC: 3.73 MIL/uL — ABNORMAL LOW (ref 3.87–5.11)
RDW: 12.8 % (ref 11.5–15.5)
WBC: 7.5 10*3/uL (ref 4.0–10.5)

## 2016-05-20 LAB — POCT PREGNANCY, URINE: Preg Test, Ur: NEGATIVE

## 2016-05-20 MED ORDER — TRAMADOL HCL 50 MG PO TABS: 50.0000 mg | ORAL_TABLET | Freq: Four times a day (QID) | ORAL | 0 refills | Status: DC | PRN

## 2016-05-20 NOTE — MAU Provider Note (Signed)
Chief Complaint: Abdominal Pain   First Provider Initiated Contact with Patient 05/20/16 1438      SUBJECTIVE HPI: Monica Ramos is a 26 y.o. G0P0 who presents to maternity admissions reporting onset of menses . She denies vaginal bleeding, vaginal itching/burning, urinary symptoms, h/a, dizziness, n/v, or fever/chills.     HPI  Past Medical History:  Diagnosis Date  . ADHD (attention deficit hyperactivity disorder)   . Asthma   . Fibroid   . Headache(784.0)   . MRSA infection 2012   rt thigh  . Polycystic ovarian syndrome   . Ulcer ([place])   . Urinary tract infection    Past Surgical History:  Procedure Laterality Date  . MYRINGOTOMY    . WISDOM TOOTH EXTRACTION     Social History   Social History  . Marital status: Married    Spouse name: N/A  . Number of children: N/A  . Years of education: N/A   Occupational History  . Not on file.   Social History Main Topics  . Smoking status: Former Smoker    Types: Cigarettes    Quit date: 01/19/2016  . Smokeless tobacco: Never Used     Comment: couple a month, rare hookah  . Alcohol use No     Comment: rare  . Drug use: No  . Sexual activity: Yes    Birth control/ protection: None   Other Topics Concern  . Not on file   Social History Narrative  . No narrative on file   No current facility-administered medications on file prior to encounter.    Current Outpatient Prescriptions on File Prior to Encounter  Medication Sig Dispense Refill  . ibuprofen (ADVIL,MOTRIN) 200 MG tablet Take 600 mg by mouth every 6 (six) hours as needed for moderate pain.      No Known Allergies  ROS:  Review of Systems  Constitutional: Negative for chills, fatigue and fever.  Respiratory: Negative for shortness of breath.   Cardiovascular: Negative for chest pain.  Gastrointestinal: Positive for abdominal pain.  Genitourinary: Positive for pelvic pain and vaginal bleeding. Negative for difficulty urinating, dysuria, flank pain,  vaginal discharge and vaginal pain.  Neurological: Negative for dizziness and headaches.  Psychiatric/Behavioral: Negative.      I have reviewed patient's Past Medical Hx, Surgical Hx, Family Hx, Social Hx, medications and allergies.   Physical Exam   Patient Vitals for the past 24 hrs:  BP Temp Temp src Pulse Height Weight  05/20/16 1227 117/79 98.1 F (36.7 C) Oral 79 5\' 6"  (1.676 m) 251 lb (113.9 kg)   Constitutional: Well-developed, well-nourished female in no acute distress.  Cardiovascular: normal rate Respiratory: normal effort GI: Abd soft, non-tender. Pos BS x 4 MS: Extremities nontender, no edema, normal ROM Neurologic: Alert and oriented x 4.  GU: Neg CVAT.  PELVIC EXAM: Cervix pink, visually closed, without lesion, small amount dark red bleeding, vaginal walls and external genitalia normal Bimanual exam: Cervix 0/long/high, firm, anterior,positive CMT, uterus tender, nonenlarged, adnexa without tenderness, enlargement, or mass   LAB RESULTS Results for orders placed or performed during the hospital encounter of 05/20/16 (from the past 24 hour(s))  Urinalysis, Routine w reflex microscopic     Status: Abnormal   Collection Time: 05/20/16 12:31 PM  Result Value Ref Range   Color, Urine YELLOW YELLOW   APPearance CLEAR CLEAR   Specific Gravity, Urine 1.025 1.005 - 1.030   pH 5.0 5.0 - 8.0   Glucose, UA NEGATIVE NEGATIVE mg/dL   Hgb  urine dipstick LARGE (A) NEGATIVE   Bilirubin Urine NEGATIVE NEGATIVE   Ketones, ur NEGATIVE NEGATIVE mg/dL   Protein, ur 30 (A) NEGATIVE mg/dL   Nitrite NEGATIVE NEGATIVE   Leukocytes, UA NEGATIVE NEGATIVE   RBC / HPF TOO NUMEROUS TO COUNT 0 - 5 RBC/hpf   WBC, UA 0-5 0 - 5 WBC/hpf   Bacteria, UA NONE SEEN NONE SEEN   Squamous Epithelial / LPF 0-5 (A) NONE SEEN   Mucous PRESENT   Pregnancy, urine POC     Status: None   Collection Time: 05/20/16 12:42 PM  Result Value Ref Range   Preg Test, Ur NEGATIVE NEGATIVE  CBC     Status:  Abnormal   Collection Time: 05/20/16  2:52 PM  Result Value Ref Range   WBC 7.5 4.0 - 10.5 K/uL   RBC 3.73 (L) 3.87 - 5.11 MIL/uL   Hemoglobin 11.3 (L) 12.0 - 15.0 g/dL   HCT 32.9 (L) 36.0 - 46.0 %   MCV 88.2 78.0 - 100.0 fL   MCH 30.3 26.0 - 34.0 pg   MCHC 34.3 30.0 - 36.0 g/dL   RDW 12.8 11.5 - 15.5 %   Platelets 301 150 - 400 K/uL  Wet prep, genital     Status: None   Collection Time: 05/20/16  2:55 PM  Result Value Ref Range   Yeast Wet Prep HPF POC NONE SEEN NONE SEEN   Trich, Wet Prep NONE SEEN NONE SEEN   Clue Cells Wet Prep HPF POC NONE SEEN NONE SEEN   WBC, Wet Prep HPF POC NONE SEEN NONE SEEN   Sperm NONE SEEN     --/--/O POS (03/14 2141)  IMAGING No results found.  MAU Management/MDM: Ordered labs and reviewed results. Reviewed recent pelvic US indicating subserousal fibroid. Likely fibroid is causing dysmenorrhea/AUB today.  Pt cannot follow up with her Gyn provider r/t insurance.  Pt given contact info for [facility] to set up Gyn care.  Pt to continue ibuprofen 600 mg Q 6 hours, Rx for Tramadol 50 mg Q 6 hours PRN x10 tabs.  Pt stable at time of discharge.  ASSESSMENT 1. Dysmenorrhea   2. Subserous leiomyoma of uterus   3. Abnormal uterine bleeding (AUB)     PLAN Discharge home with bleeding precautions Return to MAU as needed for emergencies  Allergies as of 05/20/2016   No Known Allergies     Medication List    STOP taking these medications   PAMPRIN MULTI-SYMPTOM 500-25-15 MG Tabs Generic drug:  APAP-Pamabrom-Pyrilamine     TAKE these medications   ibuprofen 200 MG tablet Commonly known as:  ADVIL,MOTRIN Take 600 mg by mouth every 6 (six) hours as needed for moderate pain.   traMADol 50 MG tablet Commonly known as:  ULTRAM Take 1 tablet (50 mg total) by mouth every 6 (six) hours as needed.      Follow-up [place] for [place] Follow up.   Specialty:  Obstetrics and Gynecology Why:  Call to make appointment for  follow up Gyn care. Return to MAU as needed for emergencies. Contact information: [place] Certified Nurse-Midwife 05/20/2016  3:32 PM

## 2016-05-20 NOTE — MAU Note (Signed)
Pt started her period 5 days ago, has had constant lower abd pain ever since then.  Has taken pamprin & ibuprofen but it doesn't help.  Changed an overnight pad every 2 hours yesterday.

## 2016-05-20 NOTE — Discharge Instructions (Signed)
Dysmenorrhea °Menstrual cramps (dysmenorrhea) are caused by the muscles of the uterus tightening (contracting) during a menstrual period. For some women, this discomfort is merely bothersome. For others, dysmenorrhea can be severe enough to interfere with everyday activities for a few days each month. °Primary dysmenorrhea is menstrual cramps that last a couple of days when you start having menstrual periods or soon after. This often begins after a teenager starts having her period. As a woman gets older or has a baby, the cramps will usually lessen or disappear. Secondary dysmenorrhea begins later in life, lasts longer, and the pain may be stronger than primary dysmenorrhea. The pain may start before the period and last a few days after the period. °What are the causes? °Dysmenorrhea is usually caused by an underlying problem, such as: °· The tissue lining the uterus grows outside of the uterus in other areas of the body (endometriosis). °· The endometrial tissue, which normally lines the uterus, is found in or grows into the muscular walls of the uterus (adenomyosis). °· The pelvic blood vessels are engorged with blood just before the menstrual period (pelvic congestive syndrome). °· Overgrowth of cells (polyps) in the lining of the uterus or cervix. °· Falling down of the uterus (prolapse) because of loose or stretched ligaments. °· Depression. °· Bladder problems, infection, or inflammation. °· Problems with the intestine, a tumor, or irritable bowel syndrome. °· Cancer of the female organs or bladder. °· A severely tipped uterus. °· A very tight opening or closed cervix. °· Noncancerous tumors of the uterus (fibroids). °· Pelvic inflammatory disease (PID). °· Pelvic scarring (adhesions) from a previous surgery. °· Ovarian cyst. °· An intrauterine device (IUD) used for birth control. °What increases the risk? °You may be at greater risk of dysmenorrhea if: °· You are younger than age 30. °· You started puberty  early. °· You have irregular or heavy bleeding. °· You have never given birth. °· You have a family history of this problem. °· You are a smoker. °What are the signs or symptoms? °· Cramping or throbbing pain in your lower abdomen. °· Headaches. °· Lower back pain. °· Nausea or vomiting. °· Diarrhea. °· Sweating or dizziness. °· Loose stools. °How is this diagnosed? °A diagnosis is based on your history, symptoms, physical exam, diagnostic tests, or procedures. Diagnostic tests or procedures may include: °· Blood tests. °· Ultrasonography. °· An examination of the lining of the uterus (dilation and curettage, D&C). °· An examination inside your abdomen or pelvis with a scope (laparoscopy). °· X-rays. °· CT scan. °· MRI. °· An examination inside the bladder with a scope (cystoscopy). °· An examination inside the intestine or stomach with a scope (colonoscopy, gastroscopy). °How is this treated? °Treatment depends on the cause of the dysmenorrhea. Treatment may include: °· Pain medicine prescribed by your health care provider. °· Birth control pills or an IUD with progesterone hormone in it. °· Hormone replacement therapy. °· Nonsteroidal anti-inflammatory drugs (NSAIDs). These may help stop the production of prostaglandins. °· Surgery to remove adhesions, endometriosis, ovarian cyst, or fibroids. °· Removal of the uterus (hysterectomy). °· Progesterone shots to stop the menstrual period. °· Cutting the nerves on the sacrum that go to the female organs (presacral neurectomy). °· Electric current to the sacral nerves (sacral nerve stimulation). °· Antidepressant medicine. °· Psychiatric therapy, counseling, or group therapy. °· Exercise and physical therapy. °· Meditation and yoga therapy. °· Acupuncture. °Follow these instructions at home: °· Only take over-the-counter or prescription medicines as directed   by your health care provider.  Place a heating pad or hot water bottle on your lower back or abdomen. Do not  sleep with the heating pad.  Use aerobic exercises, walking, swimming, biking, and other exercises to help lessen the cramping.  Massage to the lower back or abdomen may help.  Stop smoking.  Avoid alcohol and caffeine. Contact a health care provider if:  Your pain does not get better with medicine.  You have pain with sexual intercourse.  Your pain increases and is not controlled with medicines.  You have abnormal vaginal bleeding with your period.  You develop nausea or vomiting with your period that is not controlled with medicine. Get help right away if: You pass out. This information is not intended to replace advice given to you by your health care provider. Make sure you discuss any questions you have with your health care provider. Document Released: 04/15/2005 Document Revised: 09/21/2015 Document Reviewed: 10/01/2012 Elsevier Interactive Patient Education  2017 [employer] Uterine Fibroids Uterine fibroids are tissue masses (tumors) that can develop in the womb (uterus). They are also called leiomyomas. This type of tumor is not cancerous (benign) and does not spread to other parts of the body outside of the pelvic area, which is between the hip bones. Occasionally, fibroids may develop in the fallopian tubes, in the cervix, or on the support structures (ligaments) that surround the uterus. You can have one or many fibroids. Fibroids can vary in size, weight, and where they grow in the uterus. Some can become quite large. Most fibroids do not require medical treatment. What are the causes? A fibroid can develop when a single uterine cell keeps growing (replicating). Most cells in the human body have a control mechanism that keeps them from replicating without control. What are the signs or symptoms? Symptoms may include:  Heavy bleeding during your period.  Bleeding or spotting between periods.  Pelvic pain and pressure.  Bladder problems, such as needing to  urinate more often (urinary frequency) or urgently.  Inability to reproduce offspring (infertility).  Miscarriages. How is this diagnosed? Uterine fibroids are diagnosed through a physical exam. Your health care provider may feel the lumpy tumors during a pelvic exam. Ultrasonography and an MRI may be done to determine the size, location, and number of fibroids. How is this treated? Treatment may include:  Watchful waiting. This involves getting the fibroid checked by your health care provider to see if it grows or shrinks. Follow your health care provider's recommendations for how often to have this checked.  Hormone medicines. These can be taken by mouth or given through an intrauterine device (IUD).  Surgery.  Removing the fibroids (myomectomy) or the uterus (hysterectomy).  Removing blood supply to the fibroids (uterine artery embolization). If fibroids interfere with your fertility and you want to become pregnant, your health care provider may recommend having the fibroids removed. Follow these instructions at home:  Keep all follow-up visits as directed by your health care provider. This is important.  Take over-the-counter and prescription medicines only as told by your health care provider.  If you were prescribed a hormone treatment, take the hormone medicines exactly as directed.  Ask your health care provider about taking iron pills and increasing the amount of dark green, leafy vegetables in your diet. These actions can help to boost your blood iron levels, which may be affected by heavy menstrual bleeding.  Pay close attention to your period and tell your health care provider about any   changes, such as:  Increased blood flow that requires you to use more pads or tampons than usual per month.  A change in the number of days that your period lasts per month.  A change in symptoms that are associated with your period, such as abdominal cramping or back pain. Contact a  health care provider if:  You have pelvic pain, back pain, or abdominal cramps that cannot be controlled with medicines.  You have an increase in bleeding between and during periods.  You soak tampons or pads in a half hour or less.  You feel lightheaded, extra tired, or weak. Get help right away if:  You faint.  You have a sudden increase in pelvic pain. This information is not intended to replace advice given to you by your health care provider. Make sure you discuss any questions you have with your health care provider. Document Released: 04/12/2000 Document Revised: 12/14/2015 Document Reviewed: 10/12/2013 Elsevier Interactive Patient Education  2017 Elsevier Inc.  

## 2016-05-21 LAB — HIV ANTIBODY (ROUTINE TESTING W REFLEX): HIV Screen 4th Generation wRfx: NONREACTIVE

## 2016-05-21 LAB — GC/CHLAMYDIA PROBE AMP (~~LOC~~) NOT AT ARMC
CHLAMYDIA, DNA PROBE: NEGATIVE
NEISSERIA GONORRHEA: NEGATIVE

## 2016-06-12 ENCOUNTER — Encounter (HOSPITAL_COMMUNITY): Payer: Self-pay | Admitting: Emergency Medicine

## 2016-06-12 ENCOUNTER — Emergency Department (HOSPITAL_COMMUNITY): Payer: Self-pay

## 2016-06-12 ENCOUNTER — Emergency Department (HOSPITAL_COMMUNITY)
Admission: EM | Admit: 2016-06-12 | Discharge: 2016-06-12 | Disposition: A | Discharge status: Home / Self Care | Payer: Self-pay | Attending: Emergency Medicine | Admitting: Emergency Medicine

## 2016-06-12 DIAGNOSIS — J069 Acute upper respiratory infection, unspecified: Secondary | ICD-10-CM | POA: Insufficient documentation

## 2016-06-12 DIAGNOSIS — Z79899 Other long term (current) drug therapy: Secondary | ICD-10-CM | POA: Insufficient documentation

## 2016-06-12 DIAGNOSIS — J45909 Unspecified asthma, uncomplicated: Secondary | ICD-10-CM | POA: Insufficient documentation

## 2016-06-12 DIAGNOSIS — Z87891 Personal history of nicotine dependence: Secondary | ICD-10-CM | POA: Insufficient documentation

## 2016-06-12 DIAGNOSIS — F909 Attention-deficit hyperactivity disorder, unspecified type: Secondary | ICD-10-CM | POA: Insufficient documentation

## 2016-06-12 MED ORDER — HYDROCODONE-HOMATROPINE 5-1.5 MG/5ML PO SYRP: 5.0000 mL | ORAL_SOLUTION | Freq: Four times a day (QID) | ORAL | 0 refills | Status: DC | PRN

## 2016-06-12 MED ORDER — ALBUTEROL SULFATE HFA 108 (90 BASE) MCG/ACT IN AERS
2.0000 | INHALATION_SPRAY | RESPIRATORY_TRACT | Status: DC | PRN
  Administered 2016-06-12: 2 via RESPIRATORY_TRACT
  Filled 2016-06-12: qty 6.7

## 2016-06-12 MED ORDER — BENZONATATE 100 MG PO CAPS
200.0000 mg | ORAL_CAPSULE | Freq: Once | ORAL | Status: AC
  Administered 2016-06-12: 200 mg via ORAL
  Filled 2016-06-12: qty 2

## 2016-06-12 NOTE — ED Notes (Signed)
Patient left at this time with all belongings. 

## 2016-06-12 NOTE — ED Triage Notes (Signed)
Pt to ED with c/o nonproductive cough and shortness of breath x's 2 days.  Also st's elevated temp yesterday.  Has been taking OTC meds without relief.

## 2016-06-12 NOTE — ED Provider Notes (Signed)
[facility] DEPT Provider Note   CSN: [id number] Arrival date & time: 06/12/16  0120  By signing my name below, I, Monica Ramos, attest that this documentation has been prepared under the direction and in the presence of Orpah Greek, MD.  Electronically Signed: Julien Nordmann, ED Scribe. 06/12/16. 3:34 AM.    History   Chief Complaint Chief Complaint  Patient presents with  . Cough   The history is provided by the patient. No language interpreter was used.   HPI Comments: Monica Ramos is a 26 y.o. female who presents to the Emergency Department complaining of sudden onset, moderate, persisting dry cough x 1 day. She reports associated nasal congestion and sore throat. Pt has a hx of asthma as a child but has not had any problems since. Pt has taken OTC medications to alleviate her symptoms without any relief.  Pt denies nausea, vomiting, and diarrhea.   Past Medical History:  Diagnosis Date  . ADHD (attention deficit hyperactivity disorder)   . Asthma   . Fibroid   . Headache(784.0)   . MRSA infection 2012   rt thigh  . Polycystic ovarian syndrome   . Ulcer ([place])   . Urinary tract infection     There are no active problems to display for this patient.   Past Surgical History:  Procedure Laterality Date  . MYRINGOTOMY    . WISDOM TOOTH EXTRACTION      OB History    Gravida Para Term Preterm AB Living   0         0   SAB TAB Ectopic Multiple Live Births         0         Home Medications    Prior to Admission medications   Medication Sig Start Date End Date Taking? Authorizing Provider  HYDROcodone-homatropine (HYCODAN) 5-1.5 MG/5ML syrup Take 5 mLs by mouth every 6 (six) hours as needed for cough. 06/12/16   Orpah Greek, MD  ibuprofen (ADVIL,MOTRIN) 200 MG tablet Take 600 mg by mouth every 6 (six) hours as needed for moderate pain.     Historical Provider, MD  traMADol (ULTRAM) 50 MG tablet Take 1 tablet (50 mg total) by mouth every 6  (six) hours as needed. 05/20/16   Elvera Maria, CNM    Family History Family History  Problem Relation Age of Onset  . Anesthesia problems Mother   . Cancer Maternal Grandmother   . Heart disease Maternal Grandfather   . Diabetes Maternal Grandfather     leg amputee  . Heart disease Paternal Uncle   . Asthma Brother   . Asthma Maternal Uncle   . Diabetes Maternal Uncle   . Hearing loss Maternal Uncle   . Heart disease Maternal Uncle   . Hypertension Maternal Uncle     Social History Social History  Substance Use Topics  . Smoking status: Former Smoker    Types: Cigarettes    Quit date: 01/19/2016  . Smokeless tobacco: Never Used     Comment: couple a month, rare hookah  . Alcohol use No     Comment: rare     Allergies   Patient has no known allergies.   Review of Systems Review of Systems  HENT: Positive for congestion and sore throat.   Respiratory: Positive for cough.   All other systems reviewed and are negative.    Physical Exam Updated Vital Signs BP 104/61 (BP Location: Right Arm)   Pulse 107  Temp 99.8 F (37.7 C) (Oral)   Resp 18   Ht 5\' 6"  (1.676 m)   Wt 255 lb (115.7 kg)   LMP 05/16/2016 (Exact Date)   SpO2 100%   BMI 41.16 kg/m   Physical Exam  Constitutional: She is oriented to person, place, and time. She appears well-developed and well-nourished. No distress.  HENT:  Head: Normocephalic and atraumatic.  Right Ear: Hearing normal.  Left Ear: Hearing normal.  Nose: Nose normal.  Mouth/Throat: Oropharynx is clear and moist and mucous membranes are normal.  Eyes: Conjunctivae and EOM are normal. Pupils are equal, round, and reactive to light.  Neck: Normal range of motion. Neck supple.  Cardiovascular: Regular rhythm, S1 normal and S2 normal.  Exam reveals no gallop and no friction rub.   No murmur heard. Pulmonary/Chest: Effort normal and breath sounds normal. No respiratory distress. She exhibits no tenderness.  Abdominal:  Soft. Normal appearance and bowel sounds are normal. There is no hepatosplenomegaly. There is no tenderness. There is no rebound, no guarding, no tenderness at McBurney's point and negative Murphy's sign. No hernia.  Musculoskeletal: Normal range of motion.  Neurological: She is alert and oriented to person, place, and time. She has normal strength. No cranial nerve deficit or sensory deficit. Coordination normal. GCS eye subscore is 4. GCS verbal subscore is 5. GCS motor subscore is 6.  Skin: Skin is warm, dry and intact. No rash noted. No cyanosis.  Psychiatric: She has a normal mood and affect. Her speech is normal and behavior is normal. Thought content normal.  Nursing note and vitals reviewed.    ED Treatments / Results  DIAGNOSTIC STUDIES: Oxygen Saturation is 100% on RA, normal by my interpretation.  COORDINATION OF CARE:  3:33 AM Discussed treatment plan with pt at bedside and pt agreed to plan.  Labs (all labs ordered are listed, but only abnormal results are displayed) Labs Reviewed - No data to display  EKG  EKG Interpretation  Date/Time:  Wednesday June 12 2016 01:32:36 EST Ventricular Rate:  108 PR Interval:  132 QRS Duration: 90 QT Interval:  326 QTC Calculation: 436 R Axis:   19 Text Interpretation:  Sinus tachycardia Cannot rule out Anterior infarct , age undetermined Abnormal ECG Confirmed by Betsey Holiday  MD, Flonnie Wierman [phone]) on 06/12/2016 3:41:37 AM       Radiology Dg Chest 2 View  Result Date: 06/12/2016 CLINICAL DATA:  Productive cough and fever x1 day EXAM: CHEST  2 VIEW COMPARISON:  None. FINDINGS: The heart size and mediastinal contours are within normal limits. Both lungs are clear. The visualized skeletal structures are unremarkable. IMPRESSION: No active cardiopulmonary disease. Electronically Signed   By: Ashley Royalty M.D.   On: 06/12/2016 02:15    Procedures Procedures (including critical care time)  Medications Ordered in ED Medications    albuterol (PROVENTIL HFA;VENTOLIN HFA) 108 (90 Base) MCG/ACT inhaler 2 puff (not administered)  benzonatate (TESSALON) capsule 200 mg (not administered)     Initial Impression / Assessment and Plan / ED Course  I have reviewed the triage vital signs and the nursing notes.  Pertinent labs & imaging results that were available during my care of the patient were reviewed by me and considered in my medical decision making (see chart for details).     Patient presents with complaints of cough and chest congestion. Symptoms present for 1 day. She reports a history of childhood asthma, but does not have any history of asthma as an adult. Currently there  is no wheezing. Chest x-ray is clear, no evidence of pneumonia. Patient's vital signs are normal. She is not hypoxic.  Final Clinical Impressions(s) / ED Diagnoses   Final diagnoses:  Upper respiratory tract infection, unspecified type   I personally performed the services described in this documentation, which was scribed in my presence. The recorded information has been reviewed and is accurate.  New Prescriptions New Prescriptions   HYDROCODONE-HOMATROPINE (HYCODAN) 5-1.5 MG/5ML SYRUP    Take 5 mLs by mouth every 6 (six) hours as needed for cough.     Orpah Greek, MD 06/12/16 [phone]

## 2016-06-17 ENCOUNTER — Encounter: Payer: Self-pay | Admitting: Obstetrics and Gynecology

## 2016-06-17 ENCOUNTER — Ambulatory Visit: Payer: Self-pay | Admitting: Obstetrics and Gynecology

## 2016-06-17 NOTE — Progress Notes (Signed)
Patient did not keep GYN appointment for 06/17/2016.  Durene Romans MD Attending Center for [employer])

## 2016-12-01 ENCOUNTER — Inpatient Hospital Stay (HOSPITAL_COMMUNITY)
Admission: AD | Admit: 2016-12-01 | Discharge: 2016-12-01 | Disposition: A | Discharge status: Home / Self Care | Payer: Self-pay | Source: Ambulatory Visit | Attending: Obstetrics and Gynecology | Admitting: Obstetrics and Gynecology

## 2016-12-01 ENCOUNTER — Encounter (HOSPITAL_COMMUNITY): Payer: Self-pay

## 2016-12-01 DIAGNOSIS — E669 Obesity, unspecified: Secondary | ICD-10-CM | POA: Insufficient documentation

## 2016-12-01 DIAGNOSIS — Z87891 Personal history of nicotine dependence: Secondary | ICD-10-CM | POA: Insufficient documentation

## 2016-12-01 DIAGNOSIS — R102 Pelvic and perineal pain: Secondary | ICD-10-CM | POA: Insufficient documentation

## 2016-12-01 DIAGNOSIS — Z3202 Encounter for pregnancy test, result negative: Secondary | ICD-10-CM | POA: Insufficient documentation

## 2016-12-01 DIAGNOSIS — N912 Amenorrhea, unspecified: Secondary | ICD-10-CM | POA: Insufficient documentation

## 2016-12-01 LAB — URINALYSIS, ROUTINE W REFLEX MICROSCOPIC
BILIRUBIN URINE: NEGATIVE
Glucose, UA: NEGATIVE mg/dL
HGB URINE DIPSTICK: NEGATIVE
Ketones, ur: NEGATIVE mg/dL
LEUKOCYTES UA: NEGATIVE
NITRITE: NEGATIVE
PH: 8 (ref 5.0–8.0)
Protein, ur: NEGATIVE mg/dL
Specific Gravity, Urine: 1.013 (ref 1.005–1.030)

## 2016-12-01 LAB — POCT PREGNANCY, URINE: Preg Test, Ur: NEGATIVE

## 2016-12-01 NOTE — MAU Note (Signed)
Pt reports being 56-57 days late for period. Some cramping, some spotting on July 2. Neg HPT. Hx of irregular periods but "not this late".

## 2016-12-01 NOTE — MAU Provider Note (Signed)
History     CSN: [id number]  Arrival date and time: 12/01/16 1545   First Provider Initiated Contact with Patient 12/01/16 1622      Chief Complaint  Patient presents with  . Abdominal Cramping   HPI Monica Ramos 26 y.o. Comes to MAU as her period is 56 days late and she is starting to have uterine cramping.  Many of her family predicts she is pregnant.  Has a history of irregular cycles since she quit taking Seasonique.  Previously she was having regular periods but she has gained weight since then (previously was 180-200 pounds).  She is married and has not seen Dr. Marvel Plan in a couple of years.  She has brand new insurance from work and will call tomorrow to make an appointment.  Is worried that her fibroid which was 1 cm identified on a previous ultrasound is keeping her from becoming pregnant.  Has not taken any pain medication today.  Pain is 4/10 currently and she declines medication now.    OB History    Gravida Para Term Preterm AB Living   0         0   SAB TAB Ectopic Multiple Live Births         0        Past Medical History:  Diagnosis Date  . ADHD (attention deficit hyperactivity disorder)   . Asthma   . Fibroid   . Headache(784.0)   . MRSA infection 2012   rt thigh  . Polycystic ovarian syndrome   . Ulcer   . Urinary tract infection     Past Surgical History:  Procedure Laterality Date  . MYRINGOTOMY    . WISDOM TOOTH EXTRACTION      Family History  Problem Relation Age of Onset  . Anesthesia problems Mother   . Cancer Maternal Grandmother   . Heart disease Maternal Grandfather   . Diabetes Maternal Grandfather        leg amputee  . Heart disease Paternal Uncle   . Asthma Brother   . Asthma Maternal Uncle   . Diabetes Maternal Uncle   . Hearing loss Maternal Uncle   . Heart disease Maternal Uncle   . Hypertension Maternal Uncle     Social History  Substance Use Topics  . Smoking status: Former Smoker    Types: Cigarettes    Quit date:  01/19/2016  . Smokeless tobacco: Never Used     Comment: couple a month, rare hookah  . Alcohol use No     Comment: rare    Allergies: No Known Allergies  Prescriptions Prior to Admission  Medication Sig Dispense Refill Last Dose  . HYDROcodone-homatropine (HYCODAN) 5-1.5 MG/5ML syrup Take 5 mLs by mouth every 6 (six) hours as needed for cough. 75 mL 0   . ibuprofen (ADVIL,MOTRIN) 200 MG tablet Take 600 mg by mouth every 6 (six) hours as needed for moderate pain.    05/20/2016 at Unknown time  . traMADol (ULTRAM) 50 MG tablet Take 1 tablet (50 mg total) by mouth every 6 (six) hours as needed. 10 tablet 0     Review of Systems  Constitutional: Negative for fever.  Gastrointestinal: Negative for constipation, diarrhea, nausea and vomiting.       Abdominal pain 4/10  Genitourinary: Positive for menstrual problem. Negative for dysuria, vaginal bleeding and vaginal discharge.   Physical Exam   Blood pressure 125/75, pulse (!) 102, temperature 98.8 F (37.1 C), temperature source Oral, resp. rate 16,  height 5\' 6"  (1.676 m), weight 251 lb (113.9 kg), last menstrual period 08/22/2016.  Physical Exam  Nursing note and vitals reviewed. Constitutional: She is oriented to person, place, and time. She appears well-developed and well-nourished.  HENT:  Head: Normocephalic.  Eyes: EOM are normal.  Neck: Neck supple.  GI: Soft. There is no tenderness.  Musculoskeletal: Normal range of motion.  Neurological: She is alert and oriented to person, place, and time.  Skin: Skin is warm and dry.  Psychiatric: She has a normal mood and affect.    MAU Course  Procedures Results for orders placed or performed during the hospital encounter of 12/01/16 (from the past 24 hour(s))  Urinalysis, Routine w reflex microscopic     Status: None   Collection Time: 12/01/16  3:47 PM  Result Value Ref Range   Color, Urine YELLOW YELLOW   APPearance CLEAR CLEAR   Specific Gravity, Urine 1.013 1.005 - 1.030    pH 8.0 5.0 - 8.0   Glucose, UA NEGATIVE NEGATIVE mg/dL   Hgb urine dipstick NEGATIVE NEGATIVE   Bilirubin Urine NEGATIVE NEGATIVE   Ketones, ur NEGATIVE NEGATIVE mg/dL   Protein, ur NEGATIVE NEGATIVE mg/dL   Nitrite NEGATIVE NEGATIVE   Leukocytes, UA NEGATIVE NEGATIVE  Pregnancy, urine POC     Status: None   Collection Time: 12/01/16  3:56 PM  Result Value Ref Range   Preg Test, Ur NEGATIVE NEGATIVE    MDM Advised to follow up with her doctor for an annual physical.  Advised healthy eating and no smoking, no drugs, no alcohol.  Advised multivitamin with folic acid by mouth daily.  Since pain is not severe, no need for labs or ultrasound today.  Assessment and Plan  Pelvic pain possibly dysmenorrhea Amenorrhea x 56 days Obesity Negative pregnancy test  Plan If no menses, repeat pregnancy test in 2 weeks. Make an appointment with your provider for an annual exam. Take ibuprofen by the package directions as needed for pain.   Terri L Burleson 12/01/2016, 4:41 PM

## 2016-12-01 NOTE — Discharge Instructions (Signed)
If no menses, repeat pregnancy test in 2 weeks. Make an appointment with your provider for an annual exam. Take ibuprofen by the package directions as needed for pain.
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
## 2016-12-26 ENCOUNTER — Ambulatory Visit (INDEPENDENT_AMBULATORY_CARE_PROVIDER_SITE_OTHER): Payer: No Typology Code available for payment source | Admitting: Student

## 2016-12-26 ENCOUNTER — Encounter: Payer: Self-pay | Admitting: Student

## 2016-12-26 VITALS — BP 133/75 | HR 91 | Ht 66.0 in | Wt 252.8 lb

## 2016-12-26 DIAGNOSIS — N911 Secondary amenorrhea: Secondary | ICD-10-CM | POA: Diagnosis not present

## 2016-12-26 DIAGNOSIS — E282 Polycystic ovarian syndrome: Secondary | ICD-10-CM | POA: Diagnosis not present

## 2016-12-26 LAB — POCT PREGNANCY, URINE: PREG TEST UR: NEGATIVE

## 2016-12-26 MED ORDER — METFORMIN HCL 500 MG PO TABS: 500.0000 mg | ORAL_TABLET | Freq: Two times a day (BID) | ORAL | 1 refills | Status: DC

## 2016-12-26 MED ORDER — CLOMIPHENE CITRATE 50 MG PO TABS: 50.0000 mg | ORAL_TABLET | Freq: Every day | ORAL | 0 refills | Status: DC

## 2016-12-26 MED ORDER — MEDROXYPROGESTERONE ACETATE 10 MG PO TABS: 10.0000 mg | ORAL_TABLET | Freq: Every day | ORAL | 0 refills | Status: DC

## 2016-12-26 NOTE — Patient Instructions (Signed)
Secondary Amenorrhea Secondary amenorrhea is the stopping of menstrual flow for 3-6 months in a female who has previously had periods. There are many possible causes. Most of these causes are not serious. Usually, treating the underlying problem causing the loss of menses will return your periods to normal. What are the causes? Some common and uncommon causes of not menstruating include:  Malnutrition.  Low blood sugar (hypoglycemia).  Polycystic ovary disease.  Stress or fear.  Breastfeeding.  Hormone imbalance.  Ovarian failure.  Medicines.  Extreme obesity.  Cystic fibrosis.  Low body weight or drastic weight reduction from any cause.  Early menopause.  Removal of ovaries or uterus.  Contraceptives.  Illness.  Long-term (chronic) illnesses.  Cushing syndrome.  Thyroid problems.  Birth control pills, patches, or vaginal rings for birth control.  What increases the risk? You may be at greater risk of secondary amenorrhea if:  You have a family history of this condition.  You have an eating disorder.  You do athletic training.  How is this diagnosed? A diagnosis is made by your health care provider taking a medical history and doing a physical exam. This will include a pelvic exam to check for problems with your reproductive organs. Pregnancy must be ruled out. Often, numerous blood tests are done to measure different hormones in the body. Urine testing may be done. Specialized exams (ultrasound, CT scan, MRI, or hysteroscopy) may have to be done as well as measuring the body mass index (BMI). How is this treated? Treatment depends on the cause of the amenorrhea. If an eating disorder is present, this can be treated with an adequate diet and therapy. Chronic illnesses may improve with treatment of the illness. Amenorrhea may be corrected with medicines, lifestyle changes, or surgery. If the amenorrhea cannot be corrected, it is sometimes possible to create a  false menstruation with medicines. Follow these instructions at home:  Maintain a healthy diet.  Manage weight problems.  Exercise regularly but not excessively.  Get adequate sleep.  Manage stress.  Be aware of changes in your menstrual cycle. Keep a record of when your periods occur. Note the date your period starts, how long it lasts, and any problems. Contact a health care provider if: Your symptoms do not get better with treatment. This information is not intended to replace advice given to you by your health care provider. Make sure you discuss any questions you have with your health care provider. Document Released: 05/27/2006 Document Revised: 09/21/2015 Document Reviewed: 10/01/2012 Elsevier Interactive Patient Education  2018 [employer]  

## 2016-12-27 ENCOUNTER — Telehealth: Payer: Self-pay | Admitting: General Practice

## 2016-12-27 ENCOUNTER — Telehealth: Payer: Self-pay | Admitting: Student

## 2016-12-27 DIAGNOSIS — N911 Secondary amenorrhea: Secondary | ICD-10-CM | POA: Insufficient documentation

## 2016-12-27 DIAGNOSIS — E282 Polycystic ovarian syndrome: Secondary | ICD-10-CM | POA: Insufficient documentation

## 2016-12-27 NOTE — Telephone Encounter (Signed)
Spoke with patient and reviewed with her and her husband clomid and provera orders.

## 2016-12-27 NOTE — Telephone Encounter (Signed)
Hi! I spoke with her; no worries! I will put a note in-thank you!

## 2016-12-27 NOTE — Telephone Encounter (Signed)
Patient called and left message stating she was just here this morning for an appointment with Monica Ramos and just received a missed call from her. Patient is returning her call. Called patient, no answer- left message stating we have received her message and will let Monica Ramos know you are returning her call.

## 2016-12-27 NOTE — Progress Notes (Signed)
Subjective:     Patient ID: Monica Ramos, female   DOB: June 17, 1990, 26 y.o.   MRN: [id number]  HPI  Patient Monica Ramos is a 26 y.o. G0P0 Here with complaints of amenorrhea for 73 days. She says that she was taking Seasonique before and when she stopped she has not had a period. She and her husband desire to conceive. She was also told that she has PCOS by Dr. Marvel Plan but she does not know what that means and has not been treated for it.  Review of Systems  HENT: Negative.   Respiratory: Negative.   Cardiovascular: Negative.   Genitourinary: Negative.   Neurological: Negative.        Objective:   Physical Exam  Constitutional: She is oriented to person, place, and time. She appears well-developed and well-nourished.  HENT:  Head: Normocephalic.  Eyes: Pupils are equal, round, and reactive to light.  Neck: Normal range of motion.  Pulmonary/Chest: Effort normal.  Abdominal: Soft.  Musculoskeletal: Normal range of motion.  Neurological: She is alert and oriented to person, place, and time. She has normal reflexes.  Skin: Skin is warm and dry.       Assessment:     1. Amenorrhea    Plan:     1. Discussed patient's case with Dr. Kennon Rounds and Dr. Harolyn Rutherford, who gave guidance on clomid/metformin/provera for inducing ovulation.   2. Called patient and her husband on the phone and reviewed in detail the following instructions (detailed instructions below) 1. Begin metformin 500 mg in the morning and evening.  2. Take Provera for 10 days to induce a withdrawal bleed.  3. Follow instructions on Clomid challenge for three months. If she is not pregnant in 3 months, call [facility] and schedule a follow-up visit to discuss next steps.  4. Patient and husband agreeable to plan, all questions answered.   Maye Hides CNM   Clomid challenge/therapy Given that patient has anovulatory cycles, discussed Clomid challenge/therapy.  She was given Rx for Provera 10mg  po qd x 10 days.   She was  told to watch for withdrawal bleeding after the course, the first day of bleeding will be Day 1 for her next cycle.  She was also given a Rx for Clomid 50mg  po qd to take on days 5-9 of her cycle.  The risks of Clomid including ovarian hyperstimulation with possible risk of ovarian cancer as well as multiple gestation were discussed with patient.  Patient also advised to continue frequent intercourse especially around Day 14 of her upcoming cycle (qod intercourse around days 9 - 18).  By Day 35, patient was told to call if she had her period.  If patient has bleeding at end of cycle, will increase Clomid by 50mg  for the following cycle; she can have a total of 6 cycles.  However if patient does not have bleeding, she was told to do a pregnancy test/come in for evaluation.   CLOMID PATIENT INSTRUCTIONS  WHY USE IT? Clomid helps your ovaries to release eggs (ovulate).  HOW TO USE IT? Clomid is taken as a pill usually on days 5,6,7,8, & 9 of your cycle.  Day 1 is the first day of your period. The dose or duration may be changed to achieve ovulation.  Provera (progesterone) may first be used to bring on a period for some patients.  If you do not get pregnant this cycle, for your next cycles, take on days 1, 2, 3, 4 and 5.  If you do  not get a period, take Provera 10 mg daily for 10 days to bring on a period; the first day you get bleeding is Day 1 of your cycle. The day of ovulation on Clomid is usually between cycle day 14 and 17.  Having sexual intercourse at least every other day between cycle day 13 and 18 will improve your chances of becoming pregnant during the Clomid cycle.  You may monitor your ovulation using basal body temperature charts or with ovulation kits.  If using the ovulation predictor kits, having intercourse the day of the surge and the two days following is recommended. If you get your period, call when it starts for an appointment with your doctor, so that an exam may be done, and  another Clomid cycle can be considered if appropriate. If you do not get a period by day 35 of the cycle, please get a blood pregnancy test.  If it is negative, speak to your doctor for instructions to bring on another period and to plan a follow-up appointment.  THINGS TO KNOW: If you get pregnant while using Clomid, your chance of twins is 7% and triplets is less than 1%. Some studies have suggested the use of "fertility drugs" may increase your risk of ovarian cancers in the future.  It is unclear if these drugs increase the risk, or people who have problems with fertility are prone for these cancers.  If there is an actual risk, it is very low.  If you have a history of liver problems or ovarian cancer, it may be wise to avoid this medication.  SIDE EFFECTS:  The most common side effect is hot flashes (20%).  Breast tenderness, headaches, nausea, bloating may also occur at different times.  Less than 3/1,000 people have dryness or loss of hair.  Persistent ovarian cysts may form from the use of this medication.  Ovarian hyperstimulation syndrome is a rare side effect at low doses.  Visual changes like flashes of light or blurring.

## 2017-01-08 ENCOUNTER — Encounter (HOSPITAL_COMMUNITY): Payer: Self-pay

## 2017-01-08 ENCOUNTER — Inpatient Hospital Stay (HOSPITAL_COMMUNITY)
Admission: AD | Admit: 2017-01-08 | Discharge: 2017-01-08 | Disposition: A | Discharge status: Home / Self Care | Payer: No Typology Code available for payment source | Source: Ambulatory Visit | Attending: Family Medicine | Admitting: Family Medicine

## 2017-01-08 DIAGNOSIS — F909 Attention-deficit hyperactivity disorder, unspecified type: Secondary | ICD-10-CM | POA: Diagnosis not present

## 2017-01-08 DIAGNOSIS — Z833 Family history of diabetes mellitus: Secondary | ICD-10-CM | POA: Diagnosis not present

## 2017-01-08 DIAGNOSIS — E282 Polycystic ovarian syndrome: Secondary | ICD-10-CM | POA: Diagnosis not present

## 2017-01-08 DIAGNOSIS — Z79899 Other long term (current) drug therapy: Secondary | ICD-10-CM | POA: Diagnosis not present

## 2017-01-08 DIAGNOSIS — Z319 Encounter for procreative management, unspecified: Secondary | ICD-10-CM

## 2017-01-08 DIAGNOSIS — Z3202 Encounter for pregnancy test, result negative: Secondary | ICD-10-CM

## 2017-01-08 DIAGNOSIS — Z8249 Family history of ischemic heart disease and other diseases of the circulatory system: Secondary | ICD-10-CM | POA: Diagnosis not present

## 2017-01-08 DIAGNOSIS — Z825 Family history of asthma and other chronic lower respiratory diseases: Secondary | ICD-10-CM | POA: Insufficient documentation

## 2017-01-08 DIAGNOSIS — R1030 Lower abdominal pain, unspecified: Secondary | ICD-10-CM

## 2017-01-08 DIAGNOSIS — F17210 Nicotine dependence, cigarettes, uncomplicated: Secondary | ICD-10-CM | POA: Insufficient documentation

## 2017-01-08 DIAGNOSIS — Z9889 Other specified postprocedural states: Secondary | ICD-10-CM | POA: Insufficient documentation

## 2017-01-08 DIAGNOSIS — J45909 Unspecified asthma, uncomplicated: Secondary | ICD-10-CM | POA: Insufficient documentation

## 2017-01-08 DIAGNOSIS — Z793 Long term (current) use of hormonal contraceptives: Secondary | ICD-10-CM | POA: Diagnosis not present

## 2017-01-08 DIAGNOSIS — R109 Unspecified abdominal pain: Secondary | ICD-10-CM | POA: Diagnosis present

## 2017-01-08 DIAGNOSIS — Z791 Long term (current) use of non-steroidal anti-inflammatories (NSAID): Secondary | ICD-10-CM | POA: Insufficient documentation

## 2017-01-08 LAB — URINALYSIS, ROUTINE W REFLEX MICROSCOPIC
BILIRUBIN URINE: NEGATIVE
Glucose, UA: NEGATIVE mg/dL
Hgb urine dipstick: NEGATIVE
KETONES UR: NEGATIVE mg/dL
Nitrite: NEGATIVE
PH: 8 (ref 5.0–8.0)
Protein, ur: NEGATIVE mg/dL
SPECIFIC GRAVITY, URINE: 1.015 (ref 1.005–1.030)

## 2017-01-08 LAB — POCT PREGNANCY, URINE: PREG TEST UR: NEGATIVE

## 2017-01-08 NOTE — MAU Note (Signed)
Pt reports she was seen in the clinic and started provera on 08/31, finished it on 09/09. States she still has not started a period but is cramping really bad. Reports she was told the bleeding would start by now.

## 2017-01-08 NOTE — MAU Provider Note (Signed)
History     CSN: [id number]  Arrival date and time: 01/08/17 1341   First Provider Initiated Contact with Patient 01/08/17 1533      Chief Complaint  Patient presents with  . Abdominal Pain   HPI   Monica Ramos is a 26 y.o. non-pregnant female with a history of PCOS, G0P0 here in MAU with abdominal pain. She highly desires pregnancy. States she was given provera to induce ovulation and she took that as prescribed for 10 days.She was supposed to start her period 3 days ago however has not started. She has a lot of questions on where to go from here since her menstrual cycle has not started. She has not tried to schedule an appointment in the [facility] to discuss. She has not started her metformin that was prescribed.  She started cramping today. The pain is located in her lower abdomen and feels like her period is going to start.   OB History    Gravida Para Term Preterm AB Living   0         0   SAB TAB Ectopic Multiple Live Births         0        Past Medical History:  Diagnosis Date  . ADHD (attention deficit hyperactivity disorder)   . Asthma   . Fibroid   . Headache(784.0)   . MRSA infection 2012   rt thigh  . Polycystic ovarian syndrome   . Ulcer   . Urinary tract infection     Past Surgical History:  Procedure Laterality Date  . MYRINGOTOMY    . WISDOM TOOTH EXTRACTION      Family History  Problem Relation Age of Onset  . Anesthesia problems Mother   . Cancer Maternal Grandmother   . Heart disease Maternal Grandfather   . Diabetes Maternal Grandfather        leg amputee  . Heart disease Paternal Uncle   . Asthma Brother   . Asthma Maternal Uncle   . Diabetes Maternal Uncle   . Hearing loss Maternal Uncle   . Heart disease Maternal Uncle   . Hypertension Maternal Uncle     Social History  Substance Use Topics  . Smoking status: Current Some Day Smoker    Types: Cigarettes  . Smokeless tobacco: Never Used     Comment: hooka  . Alcohol use No   Comment: rare    Allergies: No Known Allergies  Prescriptions Prior to Admission  Medication Sig Dispense Refill Last Dose  . acetaminophen (TYLENOL) 500 MG tablet Take 1,000 mg by mouth.    Past Week at Unknown time  . ibuprofen (ADVIL,MOTRIN) 200 MG tablet Take 600 mg by mouth every 6 (six) hours as needed for moderate pain.    Past Week at Unknown time  . clomiPHENE (CLOMID) 50 MG tablet Take 1 tablet (50 mg total) by mouth daily. 15 tablet 0   . medroxyPROGESTERone (PROVERA) 10 MG tablet Take 1 tablet (10 mg total) by mouth daily. (Patient not taking: Reported on 01/08/2017) 30 tablet 0 Completed Course at Unknown time  . metFORMIN (GLUCOPHAGE) 500 MG tablet Take 1 tablet (500 mg total) by mouth 2 (two) times daily with a meal. 90 tablet 1    Results for orders placed or performed during the hospital encounter of 01/08/17 (from the past 48 hour(s))  Urinalysis, Routine w reflex microscopic     Status: Abnormal   Collection Time: 01/08/17  1:45 PM  Result Value  Ref Range   Color, Urine YELLOW YELLOW   APPearance CLEAR CLEAR   Specific Gravity, Urine 1.015 1.005 - 1.030   pH 8.0 5.0 - 8.0   Glucose, UA NEGATIVE NEGATIVE mg/dL   Hgb urine dipstick NEGATIVE NEGATIVE   Bilirubin Urine NEGATIVE NEGATIVE   Ketones, ur NEGATIVE NEGATIVE mg/dL   Protein, ur NEGATIVE NEGATIVE mg/dL   Nitrite NEGATIVE NEGATIVE   Leukocytes, UA TRACE (A) NEGATIVE   RBC / HPF 0-5 0 - 5 RBC/hpf   WBC, UA 0-5 0 - 5 WBC/hpf   Bacteria, UA RARE (A) NONE SEEN   Squamous Epithelial / LPF 0-5 (A) NONE SEEN   Mucus PRESENT   Pregnancy, urine POC     Status: None   Collection Time: 01/08/17  2:13 PM  Result Value Ref Range   Preg Test, Ur NEGATIVE NEGATIVE    Comment:        THE SENSITIVITY OF THIS METHODOLOGY IS >24 mIU/mL     Review of Systems  Constitutional: Negative for fever.  Gastrointestinal: Positive for abdominal pain. Negative for nausea and vomiting.  Genitourinary: Negative for vaginal  bleeding and vaginal discharge.   Physical Exam   Blood pressure 137/89, pulse 95, temperature 99.1 F (37.3 C), temperature source Oral, resp. rate 19, height 5\' 6"  (1.676 m), weight 114.8 kg (253 lb), last menstrual period 08/22/2016, SpO2 99 %.  Physical Exam  Constitutional: She is oriented to person, place, and time. She appears well-developed and well-nourished. No distress.  Respiratory: Effort normal.  GI: Soft. She exhibits no distension. There is no tenderness. There is no rebound and no guarding.  Musculoskeletal: Normal range of motion.  Neurological: She is alert and oriented to person, place, and time.  Skin: Skin is warm. She is not diaphoretic.  Psychiatric: Her behavior is normal.   MAU Course  Procedures  None  MDM  Urine pregnancy test negative   Assessment and Plan   A:  1. Lower abdominal pain   2. Encounter for pregnancy test, result negative   3. Desire for pregnancy     P:  Discharge home in stable condition Discussed the difference between MAU and [place] Patient instructed to call and schedule an appointment with provider in [facility] Start your metformin today Return to MAU for emergencies.    Lezlie Lye, NP 01/08/2017 5:18 PM

## 2017-01-08 NOTE — Discharge Instructions (Signed)

## 2017-01-13 ENCOUNTER — Telehealth: Payer: Self-pay

## 2017-01-13 NOTE — Telephone Encounter (Signed)
Pt called and stated that she is currently taking the provera and needs to know when does she start taking the Clomid.

## 2017-01-15 NOTE — Telephone Encounter (Signed)
Pt called and stated that she got her period and it's bad.

## 2017-01-16 DIAGNOSIS — Z79899 Other long term (current) drug therapy: Secondary | ICD-10-CM | POA: Diagnosis not present

## 2017-01-16 DIAGNOSIS — N939 Abnormal uterine and vaginal bleeding, unspecified: Secondary | ICD-10-CM | POA: Diagnosis not present

## 2017-01-16 DIAGNOSIS — F17210 Nicotine dependence, cigarettes, uncomplicated: Secondary | ICD-10-CM | POA: Insufficient documentation

## 2017-01-17 ENCOUNTER — Emergency Department (HOSPITAL_COMMUNITY)
Admission: EM | Admit: 2017-01-17 | Discharge: 2017-01-17 | Disposition: A | Discharge status: Home / Self Care | Payer: No Typology Code available for payment source | Attending: Emergency Medicine | Admitting: Emergency Medicine

## 2017-01-17 ENCOUNTER — Encounter (HOSPITAL_COMMUNITY): Payer: Self-pay

## 2017-01-17 DIAGNOSIS — N939 Abnormal uterine and vaginal bleeding, unspecified: Secondary | ICD-10-CM

## 2017-01-17 LAB — CBC
HEMATOCRIT: 37.4 % (ref 36.0–46.0)
Hemoglobin: 12.7 g/dL (ref 12.0–15.0)
MCH: 30.3 pg (ref 26.0–34.0)
MCHC: 34 g/dL (ref 30.0–36.0)
MCV: 89.3 fL (ref 78.0–100.0)
Platelets: 281 10*3/uL (ref 150–400)
RBC: 4.19 MIL/uL (ref 3.87–5.11)
RDW: 12.7 % (ref 11.5–15.5)
WBC: 9.3 10*3/uL (ref 4.0–10.5)

## 2017-01-17 LAB — WET PREP, GENITAL
CLUE CELLS WET PREP: NONE SEEN
Sperm: NONE SEEN
Trich, Wet Prep: NONE SEEN
Yeast Wet Prep HPF POC: NONE SEEN

## 2017-01-17 LAB — I-STAT BETA HCG BLOOD, ED (MC, WL, AP ONLY): I-stat hCG, quantitative: <5 m[IU]/mL (ref <5)

## 2017-01-17 NOTE — ED Provider Notes (Signed)
[facility] DEPT Provider Note   CSN: [id number] Arrival date & time: 01/16/17  2222     History   Chief Complaint Chief Complaint  Patient presents with  . Vaginal Bleeding    HPI Monica Ramos is a 26 y.o. female.  The history is provided by the patient.  Vaginal Bleeding  Primary symptoms include vaginal bleeding.  Primary symptoms include no discharge, no pelvic pain, no dyspareunia, no genital lesions, no genital pain, no genital rash, no genital itching, no genital odor, no dysuria. There has been no fever. This is a recurrent problem. The current episode started more than 2 days ago. The problem occurs constantly. The problem has not changed since onset.The symptoms occur spontaneously. She is not pregnant. She has missed her period. Her LMP was days ago. The patient's menstrual history has been irregular. Pertinent negatives include no anorexia and no abdominal pain.    Past Medical History:  Diagnosis Date  . ADHD (attention deficit hyperactivity disorder)   . Asthma   . Fibroid   . Headache(784.0)   . MRSA infection 2012   rt thigh  . Polycystic ovarian syndrome   . Ulcer   . Urinary tract infection     Patient Active Problem List   Diagnosis Date Noted  . 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 PCOS (polycystic ovarian syndrome) 12/27/2016    Past Surgical History:  Procedure Laterality Date  . MYRINGOTOMY    . WISDOM TOOTH EXTRACTION      OB History    Gravida Para Term Preterm AB Living   0         0   SAB TAB Ectopic Multiple Live Births         0         Home Medications    Prior to Admission medications   Medication Sig Start Date End Date Taking? Authorizing Provider  APAP-Pamabrom-Pyrilamine (PAMPRIN MULTI-SYMPTOM MAX ST PO) Take 1 tablet by mouth every 6 (six) hours as needed. Pain   Yes [provider]  ibuprofen (ADVIL,MOTRIN) 200 MG tablet Take 600 mg by mouth every 6 (six) hours as needed for moderate pain.    Yes [provider]  medroxyPROGESTERone (PROVERA) 10 MG tablet Take 1 tablet (10 mg total) by mouth daily. 12/26/16  Yes [place], CNM  metFORMIN (GLUCOPHAGE) 500 MG tablet Take 1 tablet (500 mg total) by mouth 2 (two) times daily with a meal. Patient taking differently: Take 500 mg by mouth daily with breakfast.  12/26/16  Yes Starr [place], CNM  clomiPHENE (CLOMID) 50 MG tablet Take 1 tablet (50 mg total) by mouth daily. 12/26/16   Starr [place], CNM    Family History Family History  Problem Relation Age of Onset  . Anesthesia problems Mother   . Cancer Maternal Grandmother   . Heart disease Maternal Grandfather   . Diabetes Maternal Grandfather        leg amputee  . Heart disease Paternal Uncle   . Asthma Brother   . Asthma Maternal Uncle   . Diabetes Maternal Uncle   . Hearing loss Maternal Uncle   . Heart disease Maternal Uncle   . Hypertension Maternal Uncle     Social History Social History  Substance Use Topics  . Smoking status: Current Some Day Smoker    Types: Cigarettes  . Smokeless tobacco: Never Used     Comment: hooka  . Alcohol use No     Comment: rare  Allergies   Patient has no known allergies.   Review of Systems Review of Systems  Constitutional: Negative for fever.  Gastrointestinal: Negative for abdominal pain and anorexia.  Genitourinary: Positive for vaginal bleeding. Negative for dyspareunia, dysuria and pelvic pain.  All other systems reviewed and are negative.    Physical Exam Updated Vital Signs BP 132/75   Pulse 89   Temp 98.2 F (36.8 C) (Oral)   Resp 18   SpO2 99%   Physical Exam  Constitutional: She is oriented to person, place, and time. She appears well-developed and well-nourished. No distress.  HENT:  Head: Normocephalic and atraumatic.  Nose: Nose normal.  Mouth/Throat: No oropharyngeal exudate.  Eyes: Pupils are equal, round, and reactive to light. Conjunctivae and EOM are  normal.  Neck: Normal range of motion. Neck supple.  Cardiovascular: Normal rate, regular rhythm, normal heart sounds and intact distal pulses.   Pulmonary/Chest: Effort normal and breath sounds normal. She has no wheezes. She has no rales.  Abdominal: Soft. Bowel sounds are normal. She exhibits no mass. There is no tenderness. There is no rebound and no guarding.  Genitourinary:  Genitourinary Comments: Chaperone and vaginal bleeding no CMT or adnexal tenderness  Musculoskeletal: Normal range of motion.  Neurological: She is alert and oriented to person, place, and time. She displays normal reflexes.  Skin: Skin is warm and dry. Capillary refill takes less than 2 seconds.  Psychiatric: She has a normal mood and affect.     ED Treatments / Results   Vitals:   01/17/17 0024 01/17/17 0307  BP: 136/85 132/75  Pulse: 90 89  Resp: 18 18  Temp: 98.2 F (36.8 C)   SpO2: 100% 99%    Labs (all labs ordered are listed, but only abnormal results are displayed) Results for orders placed or performed during the hospital encounter of 01/17/17  CBC  Result Value Ref Range   WBC 9.3 4.0 - 10.5 K/uL   RBC 4.19 3.87 - 5.11 MIL/uL   Hemoglobin 12.7 12.0 - 15.0 g/dL   HCT 37.4 36.0 - 46.0 %   MCV 89.3 78.0 - 100.0 fL   MCH 30.3 26.0 - 34.0 pg   MCHC 34.0 30.0 - 36.0 g/dL   RDW 12.7 11.5 - 15.5 %   Platelets 281 150 - 400 K/uL  I-Stat Beta hCG blood, ED ([facility], [facility], [facility] only)  Result Value Ref Range   I-stat hCG, quantitative <5.0 <5 mIU/mL   Comment 3           No results found.  Radiology No results found.  Procedures Procedures (including critical care time)  Medications Ordered in ED Medications - No data to display     Final Clinical Impressions(s) / ED Diagnoses  Prolonged menstrual cycles likely from anovulatory cycles.     Strict return precautions given for  Shortness of breath, swelling or the lips or tongue, chest pain, dyspnea on exertion, new weakness or numbness  changes in vision or speech,  Inability to tolerate liquids or food, changes in voice cough, altered mental status or any concerns. No signs of systemic illness or infection. The patient is nontoxic-appearing on exam and vital signs are within normal limits.    I have reviewed the triage vital signs and the nursing notes. Pertinent labs &imaging results that were available during my care of the patient were reviewed by me and considered in my medical decision making (see chart for details).  After history, exam, and medical workup I  feel the patient has been appropriately medically screened and is safe for discharge home. Pertinent diagnoses were discussed with the patient. Patient was given return precautions.    Dottie Vaquerano, MD 01/17/17 7253

## 2017-01-17 NOTE — Telephone Encounter (Signed)
Pt called again with questions about her infertility treatment. I sent a message to Maye Hides to call this patient with recommendations.

## 2017-01-17 NOTE — ED Triage Notes (Signed)
Pt complains of vaginal and or rectal bleeding She states that she's been taking provera and metformin and hasn't started the fertility drug yet trying to get pregnant Pt can't tell if the bleeding is coming from her rectum or her vagina  She complains of her stomach hurting

## 2017-01-20 ENCOUNTER — Telehealth: Payer: Self-pay | Admitting: General Practice

## 2017-01-20 LAB — GC/CHLAMYDIA PROBE AMP (~~LOC~~) NOT AT ARMC
Chlamydia: NEGATIVE
Neisseria Gonorrhea: NEGATIVE

## 2017-01-20 NOTE — Telephone Encounter (Signed)
Patient called and left message stating she has questions about the provera. Per chart review, patient had recent ER visit due to prolonged vaginal bleeding. Called patient, no answer- left message stating we are trying to reach you to return your phone call, please call us back. You may ask to speak with me today.

## 2017-01-21 ENCOUNTER — Telehealth: Payer: Self-pay | Admitting: Student

## 2017-01-21 NOTE — Telephone Encounter (Signed)
Spoke with patient and reviewed clomid protocol. Patient has been taking clomid on days 6-10, and will start intercourse every other day between now and Day 19. Patient has not been taking metformin but has started taking 500 mg at night now, instead of the 500 mg twice a day.   Patient has follow up appt with me on 01-30-2017 to review her plan of care. All questions answered.

## 2017-01-27 NOTE — Telephone Encounter (Signed)
Patient was called by Maye Hides on 9/25, medication protocol was reviewed per her note. Patient scheduled to be seen in office on 10/4.

## 2017-01-30 ENCOUNTER — Ambulatory Visit: Payer: No Typology Code available for payment source | Admitting: Student

## 2017-02-19 ENCOUNTER — Telehealth: Payer: Self-pay | Admitting: General Practice

## 2017-02-19 NOTE — Telephone Encounter (Signed)
Patient called and left message stating she recently started on provera and was going to start her second dose but she just got her period. Patient wants to know if she should start the 2nd dose or wait. Called patient, no answer- left message stating I am trying to reach you to return your phone call, please call us back.

## 2017-04-10 ENCOUNTER — Emergency Department (HOSPITAL_COMMUNITY)
Admission: EM | Admit: 2017-04-10 | Discharge: 2017-04-10 | Discharge status: Against Medical Advice | Payer: No Typology Code available for payment source

## 2017-04-10 NOTE — ED Notes (Signed)
Patient called with no response x1. Will attempt again.

## 2017-04-10 NOTE — ED Triage Notes (Signed)
Patient called x3. No response. Patient LWBS before triage.

## 2017-04-10 NOTE — ED Notes (Signed)
Patient called x2 attempt with no response. Bathroom and outside area checked with no visualization of patient. Will attempt again.

## 2017-04-13 ENCOUNTER — Inpatient Hospital Stay (HOSPITAL_COMMUNITY)
Admission: AD | Admit: 2017-04-13 | Discharge: 2017-04-13 | Disposition: A | Discharge status: Home / Self Care | Payer: No Typology Code available for payment source | Source: Ambulatory Visit | Attending: Obstetrics & Gynecology | Admitting: Obstetrics & Gynecology

## 2017-04-13 ENCOUNTER — Encounter (HOSPITAL_COMMUNITY): Payer: Self-pay | Admitting: *Deleted

## 2017-04-13 DIAGNOSIS — M545 Low back pain: Secondary | ICD-10-CM | POA: Diagnosis present

## 2017-04-13 DIAGNOSIS — N760 Acute vaginitis: Secondary | ICD-10-CM | POA: Diagnosis not present

## 2017-04-13 DIAGNOSIS — N926 Irregular menstruation, unspecified: Secondary | ICD-10-CM | POA: Insufficient documentation

## 2017-04-13 DIAGNOSIS — B9689 Other specified bacterial agents as the cause of diseases classified elsewhere: Secondary | ICD-10-CM | POA: Diagnosis not present

## 2017-04-13 DIAGNOSIS — A084 Viral intestinal infection, unspecified: Secondary | ICD-10-CM

## 2017-04-13 DIAGNOSIS — N390 Urinary tract infection, site not specified: Secondary | ICD-10-CM | POA: Diagnosis not present

## 2017-04-13 DIAGNOSIS — N3001 Acute cystitis with hematuria: Secondary | ICD-10-CM

## 2017-04-13 DIAGNOSIS — E282 Polycystic ovarian syndrome: Secondary | ICD-10-CM

## 2017-04-13 DIAGNOSIS — F17210 Nicotine dependence, cigarettes, uncomplicated: Secondary | ICD-10-CM | POA: Insufficient documentation

## 2017-04-13 LAB — URINALYSIS, ROUTINE W REFLEX MICROSCOPIC
BILIRUBIN URINE: NEGATIVE
Glucose, UA: NEGATIVE mg/dL
Ketones, ur: NEGATIVE mg/dL
Nitrite: POSITIVE — AB
PROTEIN: NEGATIVE mg/dL
SPECIFIC GRAVITY, URINE: 1.009 (ref 1.005–1.030)
pH: 6 (ref 5.0–8.0)

## 2017-04-13 LAB — WET PREP, GENITAL
Sperm: NONE SEEN
TRICH WET PREP: NONE SEEN
Yeast Wet Prep HPF POC: NONE SEEN

## 2017-04-13 LAB — CBC WITH DIFFERENTIAL/PLATELET
Basophils Absolute: 0 10*3/uL (ref 0.0–0.1)
Basophils Relative: 0 %
Eosinophils Absolute: 0.1 10*3/uL (ref 0.0–0.7)
Eosinophils Relative: 1 %
HEMATOCRIT: 39.5 % (ref 36.0–46.0)
HEMOGLOBIN: 12.8 g/dL (ref 12.0–15.0)
LYMPHS ABS: 1.5 10*3/uL (ref 0.7–4.0)
LYMPHS PCT: 14 %
MCH: 30 pg (ref 26.0–34.0)
MCHC: 32.4 g/dL (ref 30.0–36.0)
MCV: 92.7 fL (ref 78.0–100.0)
Monocytes Absolute: 0.5 10*3/uL (ref 0.1–1.0)
Monocytes Relative: 4 %
NEUTROS PCT: 81 %
Neutro Abs: 9.1 10*3/uL — ABNORMAL HIGH (ref 1.7–7.7)
Platelets: 286 10*3/uL (ref 150–400)
RBC: 4.26 MIL/uL (ref 3.87–5.11)
RDW: 12.4 % (ref 11.5–15.5)
WBC: 11.3 10*3/uL — AB (ref 4.0–10.5)

## 2017-04-13 LAB — POCT PREGNANCY, URINE: PREG TEST UR: NEGATIVE

## 2017-04-13 MED ORDER — KETOROLAC TROMETHAMINE 60 MG/2ML IM SOLN
60.0000 mg | Freq: Once | INTRAMUSCULAR | Status: AC
  Administered 2017-04-13: 60 mg via INTRAMUSCULAR
  Filled 2017-04-13: qty 2

## 2017-04-13 MED ORDER — CIPROFLOXACIN HCL 250 MG PO TABS: 250.0000 mg | ORAL_TABLET | Freq: Two times a day (BID) | ORAL | 0 refills | Status: DC

## 2017-04-13 MED ORDER — ONDANSETRON HCL 4 MG PO TABS: 4.0000 mg | ORAL_TABLET | Freq: Four times a day (QID) | ORAL | 0 refills | Status: DC

## 2017-04-13 MED ORDER — ONDANSETRON 8 MG PO TBDP
8.0000 mg | ORAL_TABLET | Freq: Once | ORAL | Status: AC
  Administered 2017-04-13: 8 mg via ORAL
  Filled 2017-04-13: qty 1

## 2017-04-13 MED ORDER — METRONIDAZOLE 0.75 % VA GEL: 1.0000 | Freq: Two times a day (BID) | VAGINAL | 0 refills | Status: DC

## 2017-04-13 NOTE — MAU Note (Signed)
Pt here with c/o spotting and nausea. Started vomiting today. Unsure if pregnant; hasn't taken test.

## 2017-04-13 NOTE — Discharge Instructions (Signed)
Bacterial Vaginosis Bacterial vaginosis is an infection of the vagina. It happens when too many germs (bacteria) grow in the vagina. This infection puts you at risk for infections from sex (STIs). Treating this infection can lower your risk for some STIs. You should also treat this if you are pregnant. It can cause your baby to be born early. Follow these instructions at home: Medicines  Take over-the-counter and prescription medicines only as told by your doctor.  Take or use your antibiotic medicine as told by your doctor. Do not stop taking or using it even if you start to feel better. General instructions  If you your sexual partner is a woman, tell her that you have this infection. She needs to get treatment if she has symptoms. If you have a female partner, he does not need to be treated.  During treatment: ? Avoid sex. ? Do not douche. ? Avoid alcohol as told. ? Avoid breastfeeding as told.  Drink enough fluid to keep your pee (urine) clear or pale yellow.  Keep your vagina and butt (rectum) clean. ? Wash the area with warm water every day. ? Wipe from front to back after you use the toilet.  Keep all follow-up visits as told by your doctor. This is important. Preventing this condition  Do not douche.  Use only warm water to wash around your vagina.  Use protection when you have sex. This includes: ? Latex condoms. ? Dental dams.  Limit how many people you have sex with. It is best to only have sex with the same person (be monogamous).  Get tested for STIs. Have your partner get tested.  Wear underwear that is cotton or lined with cotton.  Avoid tight pants and pantyhose. This is most important in summer.  Do not use any products that have nicotine or tobacco in them. These include cigarettes and e-cigarettes. If you need help quitting, ask your doctor.  Do not use illegal drugs.  Limit how much alcohol you drink. Contact a doctor if:  Your symptoms do not get  better, even after you are treated.  You have more discharge or pain when you pee (urinate).  You have a fever.  You have pain in your belly (abdomen).  You have pain with sex.  Your bleed from your vagina between periods. Summary  This infection happens when too many germs (bacteria) grow in the vagina.  Treating this condition can lower your risk for some infections from sex (STIs).  You should also treat this if you are pregnant. It can cause early (premature) birth.  Do not stop taking or using your antibiotic medicine even if you start to feel better. This information is not intended to replace advice given to you by your health care provider. Make sure you discuss any questions you have with your health care provider. Document Released: 01/23/2008 Document Revised: 12/30/2015 Document Reviewed: 12/30/2015 Elsevier Interactive Patient Education  2017 [employer]  Urinary Tract Infection, Adult A urinary tract infection (UTI) is an infection of any part of the urinary tract. The urinary tract includes the:  Kidneys.  Ureters.  Bladder.  Urethra.  These organs make, store, and get rid of pee (urine) in the body. Follow these instructions at home:  Take over-the-counter and prescription medicines only as told by your doctor.  If you were prescribed an antibiotic medicine, take it as told by your doctor. Do not stop taking the antibiotic even if you start to feel better.  Avoid the  following drinks: ? Alcohol. ? Caffeine. ? Tea. ? Carbonated drinks.  Drink enough fluid to keep your pee clear or pale yellow.  Keep all follow-up visits as told by your doctor. This is important.  Make sure to: ? Empty your bladder often and completely. Do not to hold pee for long periods of time. ? Empty your bladder before and after sex. ? Wipe from front to back after a bowel movement if you are female. Use each tissue one time when you wipe. Contact a doctor if:  You have  back pain.  You have a fever.  You feel sick to your stomach (nauseous).  You throw up (vomit).  Your symptoms do not get better after 3 days.  Your symptoms go away and then come back. Get help right away if:  You have very bad back pain.  You have very bad lower belly (abdominal) pain.  You are throwing up and cannot keep down any medicines or water. This information is not intended to replace advice given to you by your health care provider. Make sure you discuss any questions you have with your health care provider. Document Released: 10/02/2007 Document Revised: 09/21/2015 Document Reviewed: 03/06/2015 Elsevier Interactive Patient Education  [employer].

## 2017-04-13 NOTE — MAU Note (Addendum)
Pt reports on and off spotting since November 20th, prior to that her last period was October 22nd.. Pt reports cramping 8/10 since yesterday on her L side.. Pt has vomited twice today and has nausea for 3 days.

## 2017-04-13 NOTE — MAU Provider Note (Signed)
History     CSN: [id number]  Arrival date and time: 04/13/17 8101   First Provider Initiated Contact with Patient 04/13/17 2022      Chief Complaint  Patient presents with  . Emesis  . Vaginal Bleeding   HPI  [name] is a 26 y.o. G0P0 who presents to MAU today with complaint of pelvic pain, low back pain and spotting x 1 month. The patient states spotting has been off and on. She states that she has PCOS and often needs Provera to have a period. She did not take any Provera last month, but started spotting then. She is on Metformin. She rates her pain at 8/10 now. She tried Ibuprofen yesterday, but none today. She states N/V and loose stools today as well. Her sister's kids recently had similar symptoms. She denies UTI symptoms or flank pain, but has been taking AZO tabs because she was having dysuria.   OB History    Gravida Para Term Preterm AB Living   0         0   SAB TAB Ectopic Multiple Live Births         0        Past Medical History:  Diagnosis Date  . ADHD (attention deficit hyperactivity disorder)   . Asthma   . Fibroid   . Headache(784.0)   . MRSA infection 2012   rt thigh  . Polycystic ovarian syndrome   . Ulcer   . Urinary tract infection     Past Surgical History:  Procedure Laterality Date  . MYRINGOTOMY    . WISDOM TOOTH EXTRACTION      Family History  Problem Relation Age of Onset  . Anesthesia problems Mother   . Cancer Maternal Grandmother   . Heart disease Maternal Grandfather   . Diabetes Maternal Grandfather        leg amputee  . Heart disease Paternal Uncle   . Asthma Brother   . Asthma Maternal Uncle   . Diabetes Maternal Uncle   . Hearing loss Maternal Uncle   . Heart disease Maternal Uncle   . Hypertension Maternal Uncle     Social History   Tobacco Use  . Smoking status: Current Some Day Smoker    Types: Cigarettes  . Smokeless tobacco: Never Used  . Tobacco comment: hooka  Substance Use Topics  . Alcohol use:  No    Comment: rare  . Drug use: No    Allergies: No Known Allergies  Medications Prior to Admission  Medication Sig Dispense Refill Last Dose  . clomiPHENE (CLOMID) 50 MG tablet Take 1 tablet (50 mg total) by mouth daily. 15 tablet 0 Past Month at Unknown time  . ibuprofen (ADVIL,MOTRIN) 200 MG tablet Take 600 mg by mouth every 6 (six) hours as needed for moderate pain.    Past Week at Unknown time  . medroxyPROGESTERone (PROVERA) 10 MG tablet Take 1 tablet (10 mg total) by mouth daily. 30 tablet 0 Past Month at Unknown time  . metFORMIN (GLUCOPHAGE) 500 MG tablet Take 1 tablet (500 mg total) by mouth 2 (two) times daily with a meal. (Patient taking differently: Take 500 mg by mouth daily with breakfast. ) 90 tablet 1 Past Month at Unknown time  . APAP-Pamabrom-Pyrilamine (PAMPRIN MULTI-SYMPTOM MAX ST PO) Take 1 tablet by mouth every 6 (six) hours as needed. Pain   More than a month at Unknown time    Review of Systems  Constitutional: Negative for fever.  Gastrointestinal: Positive for abdominal pain, nausea and vomiting. Negative for constipation and diarrhea.  Genitourinary: Positive for pelvic pain and vaginal bleeding. Negative for dysuria, frequency, urgency and vaginal discharge.   Physical Exam   Blood pressure (!) 103/57, pulse 91, temperature 98.4 F (36.9 C), temperature source Oral, SpO2 99 %.  Physical Exam  Nursing note and vitals reviewed. Constitutional: She is oriented to person, place, and time. She appears well-developed and well-nourished. No distress.  HENT:  Head: Normocephalic and atraumatic.  Cardiovascular: Normal rate, regular rhythm and normal heart sounds.  Respiratory: Effort normal and breath sounds normal. No respiratory distress.  GI: Soft. Bowel sounds are normal. She exhibits no distension and no mass. There is no tenderness. There is no rebound and no guarding.  Genitourinary: Uterus is tender (mild). Uterus is not enlarged. Cervix exhibits no  motion tenderness, no discharge and no friability. Right adnexum displays no mass and no tenderness. Left adnexum displays no mass and no tenderness. No bleeding in the vagina. Vaginal discharge (small, white) found.  Neurological: She is alert and oriented to person, place, and time.  Skin: Skin is warm and dry. No erythema.  Psychiatric: She has a normal mood and affect.    Results for orders placed or performed during the hospital encounter of 04/13/17 (from the past 24 hour(s))  Urinalysis, Routine w reflex microscopic     Status: Abnormal   Collection Time: 04/13/17  7:55 PM  Result Value Ref Range   Color, Urine AMBER (A) YELLOW   APPearance CLEAR CLEAR   Specific Gravity, Urine 1.009 1.005 - 1.030   pH 6.0 5.0 - 8.0   Glucose, UA NEGATIVE NEGATIVE mg/dL   Hgb urine dipstick SMALL (A) NEGATIVE   Bilirubin Urine NEGATIVE NEGATIVE   Ketones, ur NEGATIVE NEGATIVE mg/dL   Protein, ur NEGATIVE NEGATIVE mg/dL   Nitrite POSITIVE (A) NEGATIVE   Leukocytes, UA TRACE (A) NEGATIVE   RBC / HPF 0-5 0 - 5 RBC/hpf   WBC, UA 6-30 0 - 5 WBC/hpf   Bacteria, UA MANY (A) NONE SEEN   Squamous Epithelial / LPF 0-5 (A) NONE SEEN   Mucus PRESENT   Pregnancy, urine POC     Status: None   Collection Time: 04/13/17  8:14 PM  Result Value Ref Range   Preg Test, Ur NEGATIVE NEGATIVE  Wet prep, genital     Status: Abnormal   Collection Time: 04/13/17  8:14 PM  Result Value Ref Range   Yeast Wet Prep HPF POC NONE SEEN NONE SEEN   Trich, Wet Prep NONE SEEN NONE SEEN   Clue Cells Wet Prep HPF POC PRESENT (A) NONE SEEN   WBC, Wet Prep HPF POC MANY (A) NONE SEEN   Sperm NONE SEEN     MAU Course  Procedures None  MDM UPT - negative UA, wet prep GC/Chlamydia, CBC, HIV, RPR today  60 mg Toradol and ODT Zofran given  Urine culture sent  Assessment and Plan  A: Viral gastroenteritis PCOS with irregular periods  Bacterial vaginosis  UTI, acute   P:  Discharge home Rx for Zofran, Cipro and  Metrogel given to patient  Continue AZO tabs PRN for dysuria  Warning signs for worsening condition discussed Patient advised to follow-up with [facility] as needed Patient may return to MAU as needed or if her condition were to change or worsen   Kerry Hough, PA-C 04/13/2017, 8:54 PM

## 2017-04-14 LAB — RPR: RPR Ser Ql: NONREACTIVE

## 2017-04-14 LAB — GC/CHLAMYDIA PROBE AMP (~~LOC~~) NOT AT ARMC
Chlamydia: POSITIVE — AB
Neisseria Gonorrhea: NEGATIVE

## 2017-04-14 LAB — HIV ANTIBODY (ROUTINE TESTING W REFLEX): HIV Screen 4th Generation wRfx: NONREACTIVE

## 2017-04-16 LAB — URINE CULTURE

## 2017-04-17 ENCOUNTER — Telehealth: Payer: Self-pay | Admitting: Student

## 2017-04-17 DIAGNOSIS — A749 Chlamydial infection, unspecified: Secondary | ICD-10-CM

## 2017-04-17 MED ORDER — AZITHROMYCIN 500 MG PO TABS: 1000.0000 mg | ORAL_TABLET | Freq: Once | ORAL | 0 refills | Status: AC

## 2017-04-17 NOTE — Telephone Encounter (Addendum)
Monica Ramos tested positive for  Chlamydia. Patient was called by RN and allergies and pharmacy confirmed. Rx sent to pharmacy of choice.   Monica Guild, NP 04/17/2017 1:15 PM        ----- Message from Bjorn Loser, RN sent at 04/17/2017 11:36 AM EST ----- This patient tested positive for: chlamydia  She has: "  NKDA", I have informed the patient of her results and confirmed her pharmacy is correct in her chart. Please send Rx.   Thank you,   Bjorn Loser, RN   Results faxed to [facility] Department.

## 2017-07-22 ENCOUNTER — Emergency Department (HOSPITAL_COMMUNITY)
Admission: EM | Admit: 2017-07-22 | Discharge: 2017-07-22 | Disposition: A | Discharge status: Home / Self Care | Payer: No Typology Code available for payment source | Attending: Emergency Medicine | Admitting: Emergency Medicine

## 2017-07-22 ENCOUNTER — Encounter (HOSPITAL_COMMUNITY): Payer: Self-pay | Admitting: Emergency Medicine

## 2017-07-22 DIAGNOSIS — J020 Streptococcal pharyngitis: Secondary | ICD-10-CM

## 2017-07-22 DIAGNOSIS — F909 Attention-deficit hyperactivity disorder, unspecified type: Secondary | ICD-10-CM | POA: Insufficient documentation

## 2017-07-22 DIAGNOSIS — F17210 Nicotine dependence, cigarettes, uncomplicated: Secondary | ICD-10-CM | POA: Insufficient documentation

## 2017-07-22 DIAGNOSIS — Z79899 Other long term (current) drug therapy: Secondary | ICD-10-CM | POA: Insufficient documentation

## 2017-07-22 DIAGNOSIS — J45909 Unspecified asthma, uncomplicated: Secondary | ICD-10-CM | POA: Insufficient documentation

## 2017-07-22 LAB — RAPID STREP SCREEN (MED CTR MEBANE ONLY): Streptococcus, Group A Screen (Direct): POSITIVE — AB

## 2017-07-22 MED ORDER — PENICILLIN G BENZATHINE 1200000 UNIT/2ML IM SUSP
1.2000 10*6.[IU] | Freq: Once | INTRAMUSCULAR | Status: AC
  Administered 2017-07-22: 1.2 10*6.[IU] via INTRAMUSCULAR
  Filled 2017-07-22: qty 2

## 2017-07-22 MED ORDER — DIPHENHYDRAMINE HCL 25 MG PO CAPS
25.0000 mg | ORAL_CAPSULE | Freq: Once | ORAL | Status: AC
Start: 2017-07-22 — End: 2017-07-22
  Administered 2017-07-22: 25 mg via ORAL
  Filled 2017-07-22: qty 1

## 2017-07-22 MED ORDER — GI COCKTAIL ~~LOC~~
30.0000 mL | Freq: Once | ORAL | Status: AC
  Administered 2017-07-22: 30 mL via ORAL
  Filled 2017-07-22: qty 30

## 2017-07-22 MED ORDER — DEXAMETHASONE SODIUM PHOSPHATE 10 MG/ML IJ SOLN: 10.0000 mg | Freq: Once | INTRAMUSCULAR | Status: DC

## 2017-07-22 NOTE — Discharge Instructions (Addendum)
Today you were diagnosed with strep throat.  You were given penicillin in the emergency department to treat this.  You can take Tylenol and NSAIDs like ibuprofen to help manage your pain.  You should follow-up with your primary care doctor within 1 week for reevaluation of your symptoms.  Return to the emergency department for any new or worsening symptoms including worsening pain, fevers, inability to swallow, changes in your voice, difficulty breathing.

## 2017-07-22 NOTE — ED Provider Notes (Signed)
Patient placed in Quick Look pathway, seen and evaluated   Chief Complaint: Allergies  HPI:   Patient states that she has hx of asthma and allergies.  States that she has had some trouble swallowing for the past day.  Is able to tolerate secretions.  Has not taken anything for her symptoms.  No CP or SOB.  No wheezing.   ROS: Trouble swallowing  Physical Exam:   Gen: No distress  Neuro: Awake and Alert  Skin: Warm    Focused Exam: tonsillar edema, but no sign of abscess, no stridor, normal phonation, tolerating secretions.  Lungs CTAB.  Will try benadryl and GI cocktail in triage.  Symptoms thought to be 2/2 allergies and post-nasal drip.   Initiation of care has begun. The patient has been counseled on the process, plan, and necessity for staying for the completion/evaluation, and the remainder of the medical screening examination    Montine Circle, Hershal Coria 07/22/17 Hubbard, Jon, MD 07/24/17 1143

## 2017-07-22 NOTE — ED Provider Notes (Signed)
[place] [facility] EMERGENCY DEPARTMENT Provider Note   CSN: [id number] Arrival date & time: 07/22/17  1345     History   Chief Complaint Chief Complaint  Patient presents with  . Sore Throat    HPI Monica Ramos is a 27 y.o. female.  HPI   Pt is a 27 y/o female who presents to the ED today c/o a sore throat that began last night but worsened today. States pain is 10/10. States pain is sharp/stabbing to entire throat, not worse on one side. States she has had pain with swallowing but has been able to tolerate PO at home. Has had associated sneezing for several days.   States that her voice feels raspy to her. Denies any fevers. Reports nasal congestion this morning. Denies rhinorrhea or postnasal drip. States she has had some difficulty breathing when she arrived to ED which has since resolved on its own. Felt that her symptoms were similar to asthma exacerbations in the past. Had dry cough this AM which has also resolved. No fevers or chest pain.   Denies any new medications, foods, detergents, soaps, lotions.  Denies any known sick contacts.  Was able to tolerate benadryl and gi cocktail given in triage as well.   Denies leg pain/swelling, hemoptysis, recent surgery/trauma, recent long travel, hormone use, personal hx of cancer, or hx of DVT/PE.   Past Medical History:  Diagnosis Date  . ADHD (attention deficit hyperactivity disorder)   . Asthma   . Fibroid   . Headache(784.0)   . MRSA infection 2012   rt thigh  . Polycystic ovarian syndrome   . Ulcer   . Urinary tract infection     Patient Active Problem List   Diagnosis Date Noted  . 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 PCOS (polycystic ovarian syndrome) 12/27/2016    Past Surgical History:  Procedure Laterality Date  . MYRINGOTOMY    . WISDOM TOOTH EXTRACTION       OB History    Gravida  0   Para      Term      Preterm      AB      Living  0     SAB      TAB      Ectopic      Multiple  0   Live Births               Home Medications    Prior to Admission medications   Medication Sig Start Date End Date Taking? Authorizing Provider  ciprofloxacin (CIPRO) 250 MG tablet Take 1 tablet (250 mg total) by mouth every 12 (twelve) hours. 04/13/17   Luvenia Redden, PA-C  ibuprofen (ADVIL,MOTRIN) 200 MG tablet Take 600 mg by mouth every 6 (six) hours as needed for moderate pain.     [provider]  medroxyPROGESTERone (PROVERA) 10 MG tablet Take 1 tablet (10 mg total) by mouth daily. 12/26/16   Starr Lake, CNM  metFORMIN (GLUCOPHAGE) 500 MG tablet Take 1 tablet (500 mg total) by mouth 2 (two) times daily with a meal. Patient taking differently: Take 500 mg by mouth daily with breakfast.  12/26/16   Starr Lake, CNM  metroNIDAZOLE (METROGEL VAGINAL) 0.75 % vaginal gel Place 1 Applicatorful vaginally 2 (two) times daily. 04/13/17   Luvenia Redden, PA-C  ondansetron (ZOFRAN) 4 MG tablet Take 1 tablet (4 mg total) by mouth every 6 (six) hours. 04/13/17   Luvenia Redden, PA-C  Family History Family History  Problem Relation Age of Onset  . Anesthesia problems Mother   . Cancer Maternal Grandmother   . Heart disease Maternal Grandfather   . Diabetes Maternal Grandfather        leg amputee  . Heart disease Paternal Uncle   . Asthma Brother   . Asthma Maternal Uncle   . Diabetes Maternal Uncle   . Hearing loss Maternal Uncle   . Heart disease Maternal Uncle   . Hypertension Maternal Uncle     Social History Social History   Tobacco Use  . Smoking status: Current Some Day Smoker    Types: Cigarettes  . Smokeless tobacco: Never Used  . Tobacco comment: hooka  Substance Use Topics  . Alcohol use: No    Comment: rare  . Drug use: No     Allergies   Patient has no known allergies.   Review of Systems Review of Systems  Constitutional: Negative for fever.  HENT: Positive for congestion, sneezing and sore  throat. Negative for postnasal drip and rhinorrhea.        Pain with swallowing  Eyes: Negative for visual disturbance.  Respiratory: Positive for cough (resolved) and shortness of breath (resolved).   Cardiovascular: Negative for chest pain.  Gastrointestinal: Negative for abdominal pain, constipation, diarrhea, nausea and vomiting.  Genitourinary: Negative for decreased urine volume.  Musculoskeletal: Negative for arthralgias.  Skin: Negative for rash.  Neurological: Negative for headaches.     Physical Exam Updated Vital Signs BP (!) 127/92 (BP Location: Right Arm)   Pulse 94   Temp 98.7 F (37.1 C) (Oral)   Resp 20   SpO2 100%   Physical Exam  Constitutional: She appears well-developed and well-nourished. No distress.  HENT:  Head: Normocephalic and atraumatic.  Right Ear: Hearing, tympanic membrane and ear canal normal.  Left Ear: Hearing, tympanic membrane and ear canal normal.  Mouth/Throat: Uvula is midline and oropharynx is clear and moist. No uvula swelling. Tonsils are 3+ on the right. Tonsils are 3+ on the left.  Scant exudate noted to the tonsils bilaterally.  No evidence of PTA or retropharyngeal abscess.  No hot potato voice.  Patient tolerating secretions.  Patent airway.  No angioedema noted. No tonsillar kissing. Cervical adenopathy bilat.  Eyes: Pupils are equal, round, and reactive to light. Conjunctivae and EOM are normal.  Neck: Normal range of motion. Neck supple.  Cardiovascular: Normal rate, regular rhythm and normal heart sounds.  No murmur heard. Pulmonary/Chest: Effort normal and breath sounds normal. No respiratory distress. She has no wheezes. She has no rhonchi.  No respiratory distress or tachypnea  Abdominal: Soft. Bowel sounds are normal. There is no tenderness.  Musculoskeletal: She exhibits no edema.  Neurological: She is alert.  Skin: Skin is warm and dry.  Psychiatric: She has a normal mood and affect.  Nursing note and vitals  reviewed.    ED Treatments / Results  Labs (all labs ordered are listed, but only abnormal results are displayed) Labs Reviewed  RAPID STREP SCREEN (NOT AT [facility]) - Abnormal; Notable for the following components:      Result Value   Streptococcus, Group A Screen (Direct) POSITIVE (*)    All other components within normal limits    EKG None  Radiology No results found.  Procedures Procedures (including critical care time)  Medications Ordered in ED Medications  penicillin g benzathine (BICILLIN LA) 1200000 UNIT/2ML injection 1.2 Million Units (has no administration in time range)  diphenhydrAMINE (BENADRYL)  capsule 25 mg (25 mg Oral Given 07/22/17 1414)  gi cocktail (Maalox,Lidocaine,Donnatal) (30 mLs Oral Given 07/22/17 1414)     Initial Impression / Assessment and Plan / ED Course  I have reviewed the triage vital signs and the nursing notes.  Pertinent labs & imaging results that were available during my care of the patient were reviewed by me and considered in my medical decision making (see chart for details).     Final Clinical Impressions(s) / ED Diagnoses   Final diagnoses:  Strep throat   Pt afebrile. With tonsillar exudate & dysphagia; diagnosis of strep. Treated in the Ed with PCN IM.  Tolerated >6oz PO in the ED. discussed importance of water rehydration. Presentation non concerning for PTA or infxn spread to soft tissue. No trismus or uvula deviation. Specific return precautions discussed. intact air way. Recommended PCP follow up. All questions answered and pt understands the plan and reasons to return.   ED Discharge Orders    None       Rodney Booze, [place] 07/22/17 1649    Pattricia Boss, MD 07/22/17 [phone]

## 2017-07-22 NOTE — ED Triage Notes (Addendum)
Pt reports history of asthma and being out of inhaler but feel she is wheezing, lungs are clear in triage, pt is in no distress. Pt reports difficult swallowing with pain.

## 2017-10-27 ENCOUNTER — Encounter (HOSPITAL_COMMUNITY): Payer: Self-pay | Admitting: Obstetrics and Gynecology

## 2017-10-27 ENCOUNTER — Emergency Department (HOSPITAL_COMMUNITY)
Admission: EM | Admit: 2017-10-27 | Discharge: 2017-10-27 | Disposition: A | Discharge status: Home / Self Care | Payer: No Typology Code available for payment source | Attending: Emergency Medicine | Admitting: Emergency Medicine

## 2017-10-27 ENCOUNTER — Other Ambulatory Visit: Payer: Self-pay

## 2017-10-27 DIAGNOSIS — F17210 Nicotine dependence, cigarettes, uncomplicated: Secondary | ICD-10-CM | POA: Insufficient documentation

## 2017-10-27 DIAGNOSIS — J45909 Unspecified asthma, uncomplicated: Secondary | ICD-10-CM | POA: Insufficient documentation

## 2017-10-27 DIAGNOSIS — L03012 Cellulitis of left finger: Secondary | ICD-10-CM | POA: Insufficient documentation

## 2017-10-27 MED ORDER — MUPIROCIN CALCIUM 2 % EX CREA: 1.0000 "application " | TOPICAL_CREAM | Freq: Three times a day (TID) | CUTANEOUS | 0 refills | Status: AC

## 2017-10-27 NOTE — ED Provider Notes (Signed)
[facility] DEPT Provider Note  CSN: [id number] Arrival date & time: 10/27/17  1348   History   Chief Complaint Chief Complaint  Patient presents with  . Hand Pain    HPI Monica Ramos is a 27 y.o. female with a medical history of asthma and PCOS who presented to the ED for left middle finger pain x3 days. She reports attempting to cut away a hang nail, Monica Ramos and red. She stated she squeezed puss out of it a couple of days ago, Monica has not noticed any other drainage or bleeding since then. Denies fever, paresthesias or weakness in affected hand. Patient is able to grip and has full movement and sensation.   Past Medical History:  Diagnosis Date  . ADHD (attention deficit hyperactivity disorder)   . Asthma   . Fibroid   . Headache(784.0)   . MRSA infection 2012   rt thigh  . Polycystic ovarian syndrome   . Ulcer   . Urinary tract infection     Patient Active Problem List   Diagnosis Date Noted  . 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 PCOS (polycystic ovarian syndrome) 12/27/2016    Past Surgical History:  Procedure Laterality Date  . MYRINGOTOMY    . WISDOM TOOTH EXTRACTION       OB History    Gravida  0   Para      Term      Preterm      AB      Living  0     SAB      TAB      Ectopic      Multiple  0   Live Births               Home Medications    Prior to Admission medications   Medication Sig Start Date End Date Taking? Authorizing Provider  ibuprofen (ADVIL,MOTRIN) 200 MG tablet Take 600 mg by mouth every 6 (six) hours as needed for moderate pain.    Yes [provider]  ciprofloxacin (CIPRO) 250 MG tablet Take 1 tablet (250 mg total) by mouth every 12 (twelve) hours. Patient not taking: Reported on 10/27/2017 04/13/17   Luvenia Redden, PA-C  medroxyPROGESTERone (PROVERA) 10 MG tablet Take 1 tablet (10 mg total) by mouth daily. Patient not taking: Reported on 10/27/2017  12/26/16   Starr Lake, CNM  metFORMIN (GLUCOPHAGE) 500 MG tablet Take 1 tablet (500 mg total) by mouth 2 (two) times daily with a meal. Patient not taking: Reported on 10/27/2017 12/26/16   Starr Lake, CNM  metroNIDAZOLE (METROGEL VAGINAL) 0.75 % vaginal gel Place 1 Applicatorful vaginally 2 (two) times daily. Patient not taking: Reported on 10/27/2017 04/13/17   Luvenia Redden, PA-C  mupirocin cream (BACTROBAN) 2 % Apply 1 application topically 3 (three) times daily for 10 days. 10/27/17 11/06/17  Mortis, Alvie [place]idelberg I, PA-C  ondansetron (ZOFRAN) 4 MG tablet Take 1 tablet (4 mg total) by mouth every 6 (six) hours. Patient not taking: Reported on 10/27/2017 04/13/17   Luvenia Redden, PA-C    Family History Family History  Problem Relation Age of Onset  . Anesthesia problems Mother   . Cancer Maternal Grandmother   . Heart disease Maternal Grandfather   . Diabetes Maternal Grandfather        leg amputee  . Heart disease Paternal Uncle   . Asthma Brother   . Asthma Maternal Uncle   .  Diabetes Maternal Uncle   . Hearing loss Maternal Uncle   . Heart disease Maternal Uncle   . Hypertension Maternal Uncle     Social History Social History   Tobacco Use  . Smoking status: Current Some Day Smoker    Types: Cigarettes  . Smokeless tobacco: Never Used  . Tobacco comment: hooka  Substance Use Topics  . Alcohol use: No    Comment: rare  . Drug use: No     Allergies   Patient has no known allergies.   Review of Systems Review of Systems  Constitutional: Negative.  Negative for chills and fever.  Musculoskeletal: Negative for arthralgias and joint swelling.  Skin: Positive for color change. Negative for pallor, rash and wound.  Neurological: Negative.  Negative for weakness and numbness.  Hematological: Negative.      Physical Exam Updated Vital Signs BP (!) 142/89 (BP Location: Right Arm)   Pulse 78   Temp 98 F (36.7 C) (Oral)   Resp 20   SpO2  100%   Physical Exam  Constitutional: She appears well-developed and well-nourished.  Musculoskeletal:       Right elbow: Normal.      Left elbow: Normal.       Right wrist: Normal.       Left wrist: Normal.       Left forearm: Normal.       Right hand: Normal.       Left hand: She exhibits tenderness. She exhibits normal range of motion, no bony tenderness, normal capillary refill, no deformity, no laceration and no swelling. Normal sensation noted. Normal strength noted.       Hands: Erythema on lateral aspect of left middle finger nail bed. Yellow crust seen on nail. No areas of fluctuance or induration.  Skin: Skin is warm and intact. Capillary refill takes less than 2 seconds. No laceration and no rash noted.  Nursing note and vitals reviewed.    ED Treatments / Results  Labs (all labs ordered are listed, Monica only abnormal results are displayed) Labs Reviewed - No data to display  EKG None  Radiology No results found.  Procedures Procedures (including critical care time)  Medications Ordered in ED Medications - No data to display   Initial Impression / Assessment and Plan / ED Course  Triage vital signs and the nursing notes have been reviewed.  Pertinent labs & imaging results that were available during care of the patient were reviewed and considered in medical decision making (see chart for details).   Patient presents with a paronychia of the left middle finger. There are no physical exam findings that would suggest that there is an abscess or that there is a more extensive infection occurring. No erythema, warmth or swelling beyond the affected nail bed to suggest cellulitis/erysipelas. Patient has full passive and active ROM. Neurovascular function is intact and has full strength. No indications for PO antibiotics or imaging today.  Final Clinical Impressions(s) / ED Diagnoses  1. Paronychia. Bactroban ointment prescribed. Education on proper cleaning and  supportive treatment for pain relief provided. Education provided on s/s of cellulitis or septic arthritis. that would warrant return to a medical provider.   Dispo: Home. After thorough clinical evaluation, this patient is determined to be medically stable and can be safely discharged with the previously mentioned treatment and/or outpatient follow-up/referral(s). At this time, there are no other apparent medical conditions that require further screening, evaluation or treatment.   Final diagnoses:  Paronychia of  finger, left    ED Discharge Orders        Ordered    mupirocin cream (BACTROBAN) 2 %  3 times daily     10/27/17 1448        Mortis, [place] I, PA-C 10/27/17 1514    Dorie Rank, MD 10/28/17 [phone]

## 2017-10-27 NOTE — ED Triage Notes (Signed)
Per Pt: Pt reports she has an infection in her finger on her left hand. Pt states she got some puss out but not all of it and pt states it is hurting more and is warm. Redness noted at site.

## 2017-10-27 NOTE — Discharge Instructions (Signed)
Bactroban is an antibiotic cream. Use this for 10 days to help with the treatment of your nail infection. Soak your hands in warm water for 10-15 minutes before applying the cream.  Here are some symptoms that would warrant return to the emergency room: fever; swelling, redness, pain that extends beyond that finger (ex. hand, wrist, elbow); difficulty with moving hands and fingers; more swelling in finger and hands; abscess formation.

## 2018-02-26 ENCOUNTER — Emergency Department (HOSPITAL_COMMUNITY)
Admission: EM | Admit: 2018-02-26 | Discharge: 2018-02-26 | Disposition: A | Discharge status: Home / Self Care | Payer: No Typology Code available for payment source | Attending: Emergency Medicine | Admitting: Emergency Medicine

## 2018-02-26 ENCOUNTER — Encounter (HOSPITAL_COMMUNITY): Payer: Self-pay | Admitting: Emergency Medicine

## 2018-02-26 DIAGNOSIS — Z79899 Other long term (current) drug therapy: Secondary | ICD-10-CM | POA: Insufficient documentation

## 2018-02-26 DIAGNOSIS — R51 Headache: Secondary | ICD-10-CM | POA: Insufficient documentation

## 2018-02-26 DIAGNOSIS — R519 Headache, unspecified: Secondary | ICD-10-CM

## 2018-02-26 DIAGNOSIS — F17210 Nicotine dependence, cigarettes, uncomplicated: Secondary | ICD-10-CM | POA: Insufficient documentation

## 2018-02-26 DIAGNOSIS — F909 Attention-deficit hyperactivity disorder, unspecified type: Secondary | ICD-10-CM | POA: Insufficient documentation

## 2018-02-26 DIAGNOSIS — J45909 Unspecified asthma, uncomplicated: Secondary | ICD-10-CM | POA: Insufficient documentation

## 2018-02-26 MED ORDER — BUTALBITAL-APAP-CAFFEINE 50-325-40 MG PO TABS
1.0000 | ORAL_TABLET | Freq: Once | ORAL | Status: AC
  Administered 2018-02-26: 1 via ORAL
  Filled 2018-02-26: qty 1

## 2018-02-26 MED ORDER — KETOROLAC TROMETHAMINE 30 MG/ML IJ SOLN
30.0000 mg | Freq: Once | INTRAMUSCULAR | Status: AC
  Administered 2018-02-26: 30 mg via INTRAMUSCULAR
  Filled 2018-02-26: qty 1

## 2018-02-26 NOTE — ED Triage Notes (Signed)
Pt c/o posterior headaches that come on at night for over month. Reports last night had nausea with headache.

## 2018-02-26 NOTE — ED Provider Notes (Signed)
[facility] DEPT Provider Note   CSN: [id number] Arrival date & time: 02/26/18  1651     History   Chief Complaint Chief Complaint  Patient presents with  . Headache    HPI Monica Ramos is a 27 y.o. female.  The history is provided by the patient and medical records. No language interpreter was used.   Monica Ramos is a 27 y.o. female  with a PMH of headaches who presents to the Emergency Department complaining of headache. Patient states that headache typically begin posteriorly, occasionally will move to temporal area. Associated with nausea sometimes, but other times not. This has been ongoing for about a month. Symptoms began after her father passed away. She has been very stressed and upset about it. She also states that she has been having trouble sleeping due to her grief, therefore has been up late playing video games. Getting really close to the TV screen does exacerbate her headache. It typically resolves with ibuprofen or going to sit in a dark room. No numbness, weakness, slurred speech, visual changes, abdominal pain, vomiting, neck pain, fever, rashes, dizziness or syncopal episodes.   Past Medical History:  Diagnosis Date  . ADHD (attention deficit hyperactivity disorder)   . Asthma   . Fibroid   . Headache(784.0)   . MRSA infection 2012   rt thigh  . Polycystic ovarian syndrome   . Ulcer   . Urinary tract infection     Patient Active Problem List   Diagnosis Date Noted  . 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 PCOS (polycystic ovarian syndrome) 12/27/2016    Past Surgical History:  Procedure Laterality Date  . MYRINGOTOMY    . WISDOM TOOTH EXTRACTION       OB History    Gravida  0   Para      Term      Preterm      AB      Living  0     SAB      TAB      Ectopic      Multiple  0   Live Births               Home Medications    Prior to Admission medications   Medication Sig Start Date End Date  Taking? Authorizing Provider  ciprofloxacin (CIPRO) 250 MG tablet Take 1 tablet (250 mg total) by mouth every 12 (twelve) hours. Patient not taking: Reported on 10/27/2017 04/13/17   Luvenia Redden, PA-C  ibuprofen (ADVIL,MOTRIN) 200 MG tablet Take 600 mg by mouth every 6 (six) hours as needed for moderate pain.     [provider]  medroxyPROGESTERone (PROVERA) 10 MG tablet Take 1 tablet (10 mg total) by mouth daily. Patient not taking: Reported on 10/27/2017 12/26/16   Starr Lake, CNM  metFORMIN (GLUCOPHAGE) 500 MG tablet Take 1 tablet (500 mg total) by mouth 2 (two) times daily with a meal. Patient not taking: Reported on 10/27/2017 12/26/16   Starr Lake, CNM  metroNIDAZOLE (METROGEL VAGINAL) 0.75 % vaginal gel Place 1 Applicatorful vaginally 2 (two) times daily. Patient not taking: Reported on 10/27/2017 04/13/17   Luvenia Redden, PA-C  ondansetron (ZOFRAN) 4 MG tablet Take 1 tablet (4 mg total) by mouth every 6 (six) hours. Patient not taking: Reported on 10/27/2017 04/13/17   Luvenia Redden, PA-C    Family History Family History  Problem Relation Age of Onset  . Anesthesia problems Mother   .  Cancer Maternal Grandmother   . Heart disease Maternal Grandfather   . Diabetes Maternal Grandfather        leg amputee  . Heart disease Paternal Uncle   . Asthma Brother   . Asthma Maternal Uncle   . Diabetes Maternal Uncle   . Hearing loss Maternal Uncle   . Heart disease Maternal Uncle   . Hypertension Maternal Uncle     Social History Social History   Tobacco Use  . Smoking status: Current Some Day Smoker    Types: Cigarettes  . Smokeless tobacco: Never Used  . Tobacco comment: hooka  Substance Use Topics  . Alcohol use: No    Comment: rare  . Drug use: No     Allergies   Patient has no known allergies.   Review of Systems Review of Systems  Gastrointestinal: Positive for nausea. Negative for abdominal pain, blood in stool,  constipation, diarrhea and vomiting.  Neurological: Positive for headaches. Negative for dizziness, syncope, speech difficulty, weakness, light-headedness and numbness.  All other systems reviewed and are negative.    Physical Exam Updated Vital Signs BP 136/87 (BP Location: Left Arm)   Pulse 97   Temp 98.6 F (37 C) (Oral)   Resp 17   LMP 11/27/2017   SpO2 100%   Physical Exam  Constitutional: She is oriented to person, place, and time. She appears well-developed and well-nourished. No distress.  HENT:  Head: Normocephalic and atraumatic.  Mouth/Throat: Oropharynx is clear and moist.  No tenderness of the temporal artery   Eyes: Pupils are equal, round, and reactive to light. Conjunctivae and EOM are normal. No scleral icterus.  No nystagmus   Neck: Normal range of motion. Neck supple.  Full active and passive ROM without pain.  No midline or paraspinal tenderness. No nuchal rigidity or meningeal signs.  Cardiovascular: Normal rate, regular rhythm, normal heart sounds and intact distal pulses.  Pulmonary/Chest: Effort normal and breath sounds normal. No respiratory distress. She has no wheezes. She has no rales.  Abdominal: Soft. Bowel sounds are normal. She exhibits no distension. There is no tenderness. There is no rebound and no guarding.  Musculoskeletal: Normal range of motion.  Lymphadenopathy:    She has no cervical adenopathy.  Neurological: She is alert and oriented to person, place, and time. She has normal reflexes. No cranial nerve deficit. Coordination normal.  Alert, oriented, thought content appropriate, able to give a coherent history. Speech is clear and goal oriented, able to follow commands.  Cranial Nerves:  II:  Peripheral visual fields grossly normal, pupils equal, round, reactive to light III, IV, VI: EOM intact bilaterally, ptosis not present V,VII: smile symmetric, eyes kept closed tightly against resistance, facial light touch sensation equal VIII:  hearing grossly normal IX, X: symmetric soft palate movement, uvula elevates symmetrically  XI: bilateral shoulder shrug symmetric and strong XII: midline tongue extension 5/5 muscle strength in upper and lower extremities bilaterally including strong and equal grip strength and dorsiflexion/plantar flexion Sensory to light touch normal in all four extremities.  Normal finger-to-nose and rapid alternating movements. No drift. Steady gait.  Skin: Skin is warm and dry. No rash noted. She is not diaphoretic.  Nursing note and vitals reviewed.    ED Treatments / Results  Labs (all labs ordered are listed, but only abnormal results are displayed) Labs Reviewed - No data to display  EKG None  Radiology No results found.  Procedures Procedures (including critical care time)  Medications Ordered in ED Medications  ketorolac (TORADOL) 30 MG/ML injection 30 mg (has no administration in time range)  butalbital-acetaminophen-caffeine (FIORICET, ESGIC) 50-325-40 MG per tablet 1 tablet (has no administration in time range)     Initial Impression / Assessment and Plan / ED Course  I have reviewed the triage vital signs and the nursing notes.  Pertinent labs & imaging results that were available during my care of the patient were reviewed by me and considered in my medical decision making (see chart for details).    Monica Ramos is a 27 y.o. female who presents to ED for headaches which improve with ibuprofen. Not worsening or constant, but occurring almost daily since she lost her father a month ago. No focal neuro deficits on exam. The patient denies any neurologic symptoms such as visual changes, focal numbness/weakness, balance problems, confusion, or speech difficulty to suggest a life-threatening intracranial process such as intracranial hemorrhage or mass. The patient has no clotting risk factors thus venous sinus thrombosis is unlikely. No fevers, neck pain or nuchal rigidity to suggest  meningitis. I feel that the patient is safe for discharge home at this time. PCP follow up strongly encouraged. Since her father's death, she has not been sleeping much nor drinking much water. Staying up late playing video games - looking at the screen makes her headache worse as well. Headaches are likely multi-factorial. We discussed life modifications that can help with headaches including sleep, stress, hydration, etc.  I have reviewed return precautions including development of neurologic symptoms, confusion, lethargy, difficulty speaking, or new/worsening/concerning symptoms. All questions answered.  Final Clinical Impressions(s) / ED Diagnoses   Final diagnoses:  Bad headache    ED Discharge Orders    None       Nethaniel Mattie, Ozella Almond, PA-C 02/26/18 1941    Isla Pence, MD 02/26/18 1946

## 2018-02-26 NOTE — Discharge Instructions (Signed)
It was my pleasure taking care of you today!  Drink plenty of fluids at home. This will help with your headache. Being well rested will help as well.  Please follow up with your primary care doctor.    Fortunately, your evaluation today is reassuring with no apparent emergent cause for your headache at this time. With that being said, it is VERY important that you monitor your symptoms at home. If you develop worsening headache, new fever, new neck stiffness, rash, weakness, numbness, trouble with your speech, trouble walking, new or worsening symptoms or any concerning symptoms, please return to the ED immediately.

## 2021-11-16 ENCOUNTER — Ambulatory Visit
Admission: RE | Admit: 2021-11-16 | Discharge: 2021-11-16 | Disposition: A | Discharge status: Home / Self Care | Payer: Commercial Managed Care - HMO | Source: Ambulatory Visit | Attending: Physician Assistant | Admitting: Physician Assistant

## 2021-11-16 VITALS — BP 120/85 | HR 78 | Temp 98.0°F | Resp 18

## 2021-11-16 DIAGNOSIS — M542 Cervicalgia: Secondary | ICD-10-CM

## 2021-11-16 MED ORDER — PREDNISONE 20 MG PO TABS: 40.0000 mg | ORAL_TABLET | Freq: Every day | ORAL | 0 refills | Status: AC

## 2021-11-16 MED ORDER — CYCLOBENZAPRINE HCL 10 MG PO TABS: 10.0000 mg | ORAL_TABLET | Freq: Two times a day (BID) | ORAL | 0 refills | Status: DC | PRN
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
## 2021-11-16 NOTE — Discharge Instructions (Signed)
  Take medication as prescribed.   Muscle relaxer (cyclobenzaprine) may cause drowsiness. Do not drive after taking medication.   Follow up with any concerns, report to ED with any worsening.

## 2021-12-13 ENCOUNTER — Ambulatory Visit: Payer: Commercial Managed Care - HMO | Admitting: Surgery

## 2021-12-25 ENCOUNTER — Other Ambulatory Visit: Payer: Self-pay

## 2021-12-25 ENCOUNTER — Ambulatory Visit
Admission: RE | Admit: 2021-12-25 | Discharge: 2021-12-25 | Disposition: A | Discharge status: Home / Self Care | Payer: Commercial Managed Care - HMO | Source: Ambulatory Visit | Attending: Physician Assistant | Admitting: Physician Assistant

## 2021-12-25 VITALS — BP 126/85 | HR 90 | Temp 98.7°F | Resp 18

## 2021-12-25 DIAGNOSIS — R059 Cough, unspecified: Secondary | ICD-10-CM | POA: Diagnosis present

## 2021-12-25 DIAGNOSIS — J069 Acute upper respiratory infection, unspecified: Secondary | ICD-10-CM

## 2021-12-25 DIAGNOSIS — E282 Polycystic ovarian syndrome: Secondary | ICD-10-CM

## 2021-12-25 DIAGNOSIS — Z20822 Contact with and (suspected) exposure to covid-19: Secondary | ICD-10-CM | POA: Insufficient documentation

## 2021-12-25 DIAGNOSIS — N939 Abnormal uterine and vaginal bleeding, unspecified: Secondary | ICD-10-CM

## 2021-12-25 LAB — POCT RAPID STREP A (OFFICE): Rapid Strep A Screen: NEGATIVE

## 2021-12-25 LAB — POCT URINE PREGNANCY: Preg Test, Ur: NEGATIVE

## 2021-12-25 MED ORDER — MEDROXYPROGESTERONE ACETATE 5 MG PO TABS: 5.0000 mg | ORAL_TABLET | Freq: Every day | ORAL | 0 refills | Status: DC

## 2021-12-25 NOTE — ED Provider Notes (Signed)
[facility]    CSN: [id number] Arrival date & time: 12/25/21  1156      History   Chief Complaint Chief Complaint  Patient presents with   Vaginal Bleeding    I have PCOS and have not stopped bleeding in over a month. - Entered by patient    HPI Monica Ramos is a 31 y.o. female.   Patient here today for evaluation of cough and congestion she has had the last 2 days. She reports sore throat. She has not had any vomiting or diarrhea. She has tried OTC meds without resolution.   She also reports vaginal bleeding that has been ongoing for one month. She has known PCOS and endometrial overgrowth. She was previously prescribed provera which was helpful in controlling bleeding when needed. She denies any shortness of breath or chest pain. She has not had headache. She has not had anemia in the past as a result of AUB.   The history is provided by the patient.  Vaginal Bleeding Associated symptoms: no fever and no nausea     Past Medical History:  Diagnosis Date   ADHD (attention deficit hyperactivity disorder)    Asthma    Fibroid    Headache(784.0)    MRSA infection 2012   rt thigh   Polycystic ovarian syndrome    Ulcer    Urinary tract infection     Patient Active Problem List   Diagnosis Date Noted   Secondary amenorrhea 12/27/2016   PCOS (polycystic ovarian syndrome) 12/27/2016    Past Surgical History:  Procedure Laterality Date   MYRINGOTOMY     WISDOM TOOTH EXTRACTION      OB History     Gravida  0   Para      Term      Preterm      AB      Living  0      SAB      IAB      Ectopic      Multiple  0   Live Births               Home Medications    Prior to Admission medications   Medication Sig Start Date End Date Taking? Authorizing Provider  medroxyPROGESTERone (PROVERA) 5 MG tablet Take 1 tablet (5 mg total) by mouth daily. 12/25/21  Yes Francene Finders, PA-C  ciprofloxacin (CIPRO) 250 MG tablet Take 1 tablet (250  mg total) by mouth every 12 (twelve) hours. Patient not taking: Reported on 10/27/2017 04/13/17   Luvenia Redden, PA-C  cyclobenzaprine (FLEXERIL) 10 MG tablet Take 1 tablet (10 mg total) by mouth 2 (two) times daily as needed for muscle spasms. 11/16/21   Francene Finders, PA-C  ibuprofen (ADVIL,MOTRIN) 200 MG tablet Take 600 mg by mouth every 6 (six) hours as needed for moderate pain.     [provider]  metFORMIN (GLUCOPHAGE) 500 MG tablet Take 1 tablet (500 mg total) by mouth 2 (two) times daily with a meal. Patient not taking: Reported on 10/27/2017 12/26/16   Starr Lake, CNM  metroNIDAZOLE (METROGEL VAGINAL) 0.75 % vaginal gel Place 1 Applicatorful vaginally 2 (two) times daily. Patient not taking: Reported on 10/27/2017 04/13/17   Luvenia Redden, PA-C  ondansetron (ZOFRAN) 4 MG tablet Take 1 tablet (4 mg total) by mouth every 6 (six) hours. Patient not taking: Reported on 10/27/2017 04/13/17   Luvenia Redden, PA-C    Family History Family History  Problem Relation Age of Onset   Anesthesia problems Mother    Cancer Maternal Grandmother    Heart disease Maternal Grandfather    Diabetes Maternal Grandfather        leg amputee   Heart disease Paternal Uncle    Asthma Brother    Asthma Maternal Uncle    Diabetes Maternal Uncle    Hearing loss Maternal Uncle    Heart disease Maternal Uncle    Hypertension Maternal Uncle     Social History Social History   Tobacco Use   Smoking status: Some Days    Types: Cigarettes   Smokeless tobacco: Never   Tobacco comments:    hooka  Vaping Use   Vaping Use: Never used  Substance Use Topics   Alcohol use: No    Comment: rare   Drug use: No     Allergies   Patient has no known allergies.   Review of Systems Review of Systems  Constitutional:  Negative for chills and fever.  HENT:  Positive for congestion and sore throat.   Eyes:  Negative for discharge and redness.  Respiratory:  Positive for cough.  Negative for shortness of breath.   Cardiovascular:  Negative for chest pain.  Gastrointestinal:  Negative for diarrhea, nausea and vomiting.  Genitourinary:  Positive for vaginal bleeding.     Physical Exam Triage Vital Signs ED Triage Vitals [12/25/21 1238]  Enc Vitals Group     BP 126/85     Pulse Rate 90     Resp 18     Temp 98.7 F (37.1 C)     Temp Source Oral     SpO2 97 %     Weight      Height      Head Circumference      Peak Flow      Pain Score 0     Pain Loc      Pain Edu?      Excl. in [place]?    No data found.  Updated Vital Signs BP 126/85 (BP Location: Left Arm)   Pulse 90   Temp 98.7 F (37.1 C) (Oral)   Resp 18   SpO2 97%      Physical Exam Vitals and nursing note reviewed.  Constitutional:      General: She is not in acute distress.    Appearance: Normal appearance. She is not ill-appearing.  HENT:     Head: Normocephalic and atraumatic.     Nose: Congestion present.     Mouth/Throat:     Mouth: Mucous membranes are moist.     Pharynx: Oropharynx is clear. Posterior oropharyngeal erythema present. No oropharyngeal exudate.  Eyes:     Conjunctiva/sclera: Conjunctivae normal.  Cardiovascular:     Rate and Rhythm: Normal rate and regular rhythm.     Heart sounds: Normal heart sounds.  Pulmonary:     Effort: Pulmonary effort is normal. No respiratory distress.     Breath sounds: Normal breath sounds. No wheezing, rhonchi or rales.  Neurological:     Mental Status: She is alert.  Psychiatric:        Mood and Affect: Mood normal.        Behavior: Behavior normal.      UC Treatments / Results  Labs (all labs ordered are listed, but only abnormal results are displayed) Labs Reviewed  SARS CORONAVIRUS 2 (TAT 6-24 HRS)  POCT RAPID STREP A (OFFICE)  POCT URINE PREGNANCY    EKG  Radiology No results found.  Procedures Procedures (including critical care time)  Medications Ordered in UC Medications - No data to  display  Initial Impression / Assessment and Plan / UC Course  I have reviewed the triage vital signs and the nursing notes.  Pertinent labs & imaging results that were available during my care of the patient were reviewed by me and considered in my medical decision making (see chart for details).    Suspect viral etiology of  upper respiratory symptoms-- will screen for covid. Advised symptomatic treatment, increased fluids, and rest. Will prescribe short course of provera and recommended further evaluation by GYN/ health department. Patient expresses understanding.   Final Clinical Impressions(s) / UC Diagnoses   Final diagnoses:  Upper respiratory tract infection, unspecified type  Abnormal uterine bleeding  PCOS (polycystic ovarian syndrome)   Discharge Instructions   None    ED Prescriptions     Medication Sig Dispense Auth. Provider   medroxyPROGESTERone (PROVERA) 5 MG tablet Take 1 tablet (5 mg total) by mouth daily. 5 tablet Francene Finders, PA-C      PDMP not reviewed this encounter.   Francene Finders, PA-C 12/25/21 1810

## 2021-12-25 NOTE — ED Triage Notes (Signed)
Pt here for sore throat and congestion x 2 days; pt sts vaginal bleeding x 1 month with hx of same; pt denies other complaint just sts is "annoyed by the bleeding"

## 2021-12-26 LAB — SARS CORONAVIRUS 2 (TAT 6-24 HRS): SARS Coronavirus 2: NEGATIVE

## 2022-01-01 ENCOUNTER — Ambulatory Visit: Payer: Commercial Managed Care - HMO | Admitting: Physician Assistant

## 2022-01-01 ENCOUNTER — Ambulatory Visit: Payer: Commercial Managed Care - HMO | Attending: Family Medicine

## 2022-01-01 ENCOUNTER — Encounter: Payer: Self-pay | Admitting: Physician Assistant

## 2022-01-01 VITALS — BP 116/85 | HR 83 | Ht 66.0 in | Wt 265.0 lb

## 2022-01-01 DIAGNOSIS — Z6841 Body Mass Index (BMI) 40.0 and over, adult: Secondary | ICD-10-CM

## 2022-01-01 DIAGNOSIS — E88819 Insulin resistance, unspecified: Secondary | ICD-10-CM | POA: Insufficient documentation

## 2022-01-01 DIAGNOSIS — E8881 Metabolic syndrome: Secondary | ICD-10-CM

## 2022-01-01 DIAGNOSIS — E282 Polycystic ovarian syndrome: Secondary | ICD-10-CM

## 2022-01-01 MED ORDER — METFORMIN HCL 500 MG PO TABS: 500.0000 mg | ORAL_TABLET | Freq: Two times a day (BID) | ORAL | 1 refills | Status: DC

## 2022-01-01 MED ORDER — MEDROXYPROGESTERONE ACETATE 10 MG PO TABS: 10.0000 mg | ORAL_TABLET | Freq: Every day | ORAL | 1 refills | Status: DC

## 2022-01-01 NOTE — Progress Notes (Signed)
New Patient Office Visit  Subjective    Patient ID: Monica Ramos, female    DOB: [birth year]  Age: 31 y.o. MRN: [id number]  CC:  Chief Complaint  Patient presents with   Vaginal Bleeding    HPI Monica Ramos states that she was seen at [facility] on 12/25/21 for vaginal bleeding.  Note from that visit:   HPI Monica Ramos is a 31 y.o. female.    Patient here today for evaluation of cough and congestion she has had the last 2 days. She reports sore throat. She has not had any vomiting or diarrhea. She has tried OTC meds without resolution.    She also reports vaginal bleeding that has been ongoing for one month. She has known PCOS and endometrial overgrowth. She was previously prescribed provera which was helpful in controlling bleeding when needed. She denies any shortness of breath or chest pain. She has not had headache. She has not had anemia in the past as a result of AUB.  Initial Impression / Assessment and Plan / UC Course  I have reviewed the triage vital signs and the nursing notes.   Pertinent labs & imaging results that were available during my care of the patient were reviewed by me and considered in my medical decision making (see chart for details).   Suspect viral etiology of  upper respiratory symptoms-- will screen for covid. Advised symptomatic treatment, increased fluids, and rest. Will prescribe short course of provera and recommended further evaluation by GYN/ health department. Patient expresses understanding.   States today that the provera did offer relief.  States that she was only given a 5 day supply and her last dose was Sunday and then her bleeding resumed yesterday.  States that she was previously treated with 10 mg once daily with relief.  States that she was due to have a Mirena placed, but lost her insurance.    States that she was also treated with metformin BID for insulin resistance.   Outpatient Encounter Medications as of 01/01/2022  Medication Sig    cyclobenzaprine (FLEXERIL) 10 MG tablet Take 1 tablet (10 mg total) by mouth 2 (two) times daily as needed for muscle spasms.   medroxyPROGESTERone (PROVERA) 10 MG tablet Take 1 tablet (10 mg total) by mouth daily.   metFORMIN (GLUCOPHAGE) 500 MG tablet Take 1 tablet (500 mg total) by mouth 2 (two) times daily with a meal.   [DISCONTINUED] ciprofloxacin (CIPRO) 250 MG tablet Take 1 tablet (250 mg total) by mouth every 12 (twelve) hours. (Patient not taking: Reported on 10/27/2017)   [DISCONTINUED] ibuprofen (ADVIL,MOTRIN) 200 MG tablet Take 600 mg by mouth every 6 (six) hours as needed for moderate pain.    [DISCONTINUED] medroxyPROGESTERone (PROVERA) 5 MG tablet Take 1 tablet (5 mg total) by mouth daily.   [DISCONTINUED] metFORMIN (GLUCOPHAGE) 500 MG tablet Take 1 tablet (500 mg total) by mouth 2 (two) times daily with a meal. (Patient not taking: Reported on 10/27/2017)   [DISCONTINUED] metroNIDAZOLE (METROGEL VAGINAL) 0.75 % vaginal gel Place 1 Applicatorful vaginally 2 (two) times daily. (Patient not taking: Reported on 10/27/2017)   [DISCONTINUED] ondansetron (ZOFRAN) 4 MG tablet Take 1 tablet (4 mg total) by mouth every 6 (six) hours. (Patient not taking: Reported on 10/27/2017)   No facility-administered encounter medications on file as of 01/01/2022.    Past Medical History:  Diagnosis Date   ADHD (attention deficit hyperactivity disorder)    Asthma    Fibroid    Headache(784.0)  MRSA infection 2012   rt thigh   Polycystic ovarian syndrome    Ulcer    Urinary tract infection     Past Surgical History:  Procedure Laterality Date   MYRINGOTOMY     WISDOM TOOTH EXTRACTION      Family History  Problem Relation Age of Onset   Anesthesia problems Mother    Cancer Maternal Grandmother    Heart disease Maternal Grandfather    Diabetes Maternal Grandfather        leg amputee   Heart disease Paternal Uncle    Asthma Brother    Asthma Maternal Uncle    Diabetes Maternal Uncle     Hearing loss Maternal Uncle    Heart disease Maternal Uncle    Hypertension Maternal Uncle     Social History   Socioeconomic History   Marital status: Married    Spouse name: Not on file   Number of children: Not on file   Years of education: Not on file   Highest education level: Not on file  Occupational History   Not on file  Tobacco Use   Smoking status: Some Days    Types: Cigarettes   Smokeless tobacco: Never   Tobacco comments:    hooka  Vaping Use   Vaping Use: Never used  Substance and Sexual Activity   Alcohol use: No    Comment: rare   Drug use: No   Sexual activity: Yes    Birth control/protection: None  Other Topics Concern   Not on file  Social History Narrative   Not on file   Social Determinants of Health   Financial Resource Strain: Not on file  Food Insecurity: Not on file  Transportation Needs: Not on file  Physical Activity: Not on file  Stress: Not on file  Social Connections: Not on file  Intimate Partner Violence: Not on file    Review of Systems  Constitutional: Negative.   HENT: Negative.    Eyes: Negative.   Respiratory:  Negative for shortness of breath.   Cardiovascular:  Negative for chest pain.  Gastrointestinal:  Negative for nausea and vomiting.  Genitourinary:  Negative for dysuria.  Musculoskeletal: Negative.   Skin: Negative.   Neurological: Negative.   Endo/Heme/Allergies: Negative.   Psychiatric/Behavioral: Negative.          Objective    BP 116/85 (BP Location: Right Arm)   Pulse 83   Ht '5\' 6"'$  (1.676 m)   Wt 265 lb (120.2 kg)   LMP 12/31/2021 (Exact Date)   BMI 42.77 kg/m   Physical Exam Vitals reviewed.  Constitutional:      Appearance: Normal appearance.  HENT:     Head: Normocephalic and atraumatic.     Right Ear: External ear normal.     Left Ear: External ear normal.     Nose: Nose normal.     Mouth/Throat:     Mouth: Mucous membranes are moist.     Pharynx: Oropharynx is clear.  Eyes:      Extraocular Movements: Extraocular movements intact.     Conjunctiva/sclera: Conjunctivae normal.     Pupils: Pupils are equal, round, and reactive to light.  Cardiovascular:     Rate and Rhythm: Normal rate and regular rhythm.     Pulses: Normal pulses.     Heart sounds: Normal heart sounds.  Pulmonary:     Effort: Pulmonary effort is normal.     Breath sounds: Normal breath sounds.  Musculoskeletal:  General: Normal range of motion.     Cervical back: Normal range of motion and neck supple.  Skin:    General: Skin is warm and dry.  Neurological:     General: No focal deficit present.     Mental Status: She is alert and oriented to person, place, and time.  Psychiatric:        Mood and Affect: Mood normal.        Behavior: Behavior normal.        Thought Content: Thought content normal.        Judgment: Judgment normal.        Assessment & Plan:   Problem List Items Addressed This Visit       Endocrine   PCOS (polycystic ovarian syndrome) - Primary   Relevant Medications   medroxyPROGESTERone (PROVERA) 10 MG tablet   Other Relevant Orders   CBC with Differential/Platelet   Comp. Metabolic Panel (12)   Other Visit Diagnoses     Insulin resistance       Relevant Medications   metFORMIN (GLUCOPHAGE) 500 MG tablet   Class 3 severe obesity due to excess calories with body mass index (BMI) of 40.0 to 44.9 in adult, unspecified whether serious comorbidity present (HCC)       Relevant Medications   metFORMIN (GLUCOPHAGE) 500 MG tablet      1. PCOS (polycystic ovarian syndrome) Patient did have previous prescriptions available for review on her MyChart account as well as lab results from May 2022 showing insulin resistance, normal thyroid function, normal kidney and liver function.  Patient encouraged to present to [facility] to have lab completed, patient will pick up application for [place] financial assistance  Patient education  given on supportive care, red flags given for prompt reevaluation.  Patient does have appointment upcoming with women's health, encouraged to keep that appointment.  Patient understands and agrees - medroxyPROGESTERone (PROVERA) 10 MG tablet; Take 1 tablet (10 mg total) by mouth daily.  Dispense: 30 tablet; Refill: 1 - CBC with Differential/Platelet; Future - Comp. Metabolic Panel (12); Future  2. Insulin resistance Restart  - metFORMIN (GLUCOPHAGE) 500 MG tablet; Take 1 tablet (500 mg total) by mouth 2 (two) times daily with a meal.  Dispense: 60 tablet; Refill: 1  3. Class 3 severe obesity due to excess calories with body mass index (BMI) of 40.0 to 44.9 in adult, unspecified whether serious comorbidity present ([place])   I have reviewed the patient's medical history (PMH, PSH, Social History, Family History, Medications, and allergies) , and have been updated if relevant. I spent 30 minutes reviewing chart and  face to face time with patient.    Return if symptoms worsen or fail to improve.   Loraine Grip Mayers, PA-C

## 2022-01-01 NOTE — Patient Instructions (Signed)
You are going to restart Provera at 10 mg once daily, and metformin 500 mg twice daily.  We will call you with your lab results when they are available.  Please let us know if there is anything else we can do for you.  Kennieth Rad, PA-C Physician Assistant [email or web address]   Diet for Polycystic Ovary Syndrome Polycystic ovary syndrome (PCOS) is a common hormonal disorder that affects a woman's reproductive system. It can cause problems with menstrual periods and make it hard to get and stay pregnant. Changing what you eat can help your hormones reach normal levels, improve your health, and help you better manage PCOS. Following a balanced diet can help you lose weight and improve the way that your body uses the hormone insulin to control blood sugar. This may include: Eating low-fat (lean) proteins, complex carbohydrates, fresh fruits and vegetables, low-fat dairy products, healthy fats, and fiber. Cutting down on calories. Exercising regularly. What are tips for following this plan? Follow a balanced diet for meals and snacks. Eat breakfast, lunch, dinner, and one or two snacks every day. Include protein in each meal and snack. Choose whole grains instead of products that are made with refined flour. Eat a variety of foods. Exercise regularly as told by your health care provider. Aim to do at least 30 minutes of exercise on most days of the week. If you are overweight or obese: Pay attention to how many calories you eat. Cutting down on calories can help you lose weight. Work with your health care provider or a dietitian to figure out how many calories you need each day. What foods should I eat?  Fruits Include a variety of colors and types. All fruits are helpful for PCOS. Vegetables Include a variety of colors and types. All vegetables are helpful for PCOS. Grains Whole grains, such as whole wheat. Whole-grain  breads, crackers, cereals, and pasta. Unsweetened oatmeal. Bulgur, barley, quinoa, and brown rice. Tortillas made from corn or whole-wheat flour. Meats and other proteins Lean proteins, such as fish, chicken, beans, eggs, and tofu. Dairy Low-fat dairy products, such as skim milk, cheese sticks, and yogurt. Beverages Low-fat or fat-free drinks, such as water, low-fat milk, sugar-free drinks, and small amounts of 100% fruit juice. Seasonings and condiments Ketchup. Mustard. Barbecue sauce. Relish. Low-fat or fat-free mayonnaise. Fats and oils Olive oil or canola oil. Walnuts and almonds. The items listed above may not be a complete list of recommended foods and beverages. Contact a dietitian for more options. What foods should I avoid? Foods that are high in calories or fat, especially saturated or trans fats. Fried foods. Sweets. Products that are made from refined white flour, including white bread, pastries, white rice, and pasta. The items listed above may not be a complete list of foods and beverages to avoid. Contact a dietitian for more information. Summary PCOS is a hormonal imbalance that affects a woman's reproductive system. It can cause problems with menstrual periods and make it hard to get and stay pregnant. You can help to manage your PCOS by exercising regularly and eating a healthy, varied diet of vegetables, fruit, whole grains, lean protein, and low-fat dairy products. Changing what you eat can improve the way that your body uses insulin, help your hormones reach normal levels, and help you lose weight. This information is not intended to replace advice given to you by your health care provider. Make sure you discuss any questions you have with your health care provider.  Document Revised: 09/23/2019 Document Reviewed: 09/23/2019 Elsevier Patient Education  [place].

## 2022-01-02 LAB — CBC WITH DIFFERENTIAL/PLATELET
Basophils Absolute: 0 10*3/uL (ref 0.0–0.2)
Basos: 0 %
EOS (ABSOLUTE): 0.1 10*3/uL (ref 0.0–0.4)
Eos: 1 %
Hematocrit: 40.3 % (ref 34.0–46.6)
Hemoglobin: 13.5 g/dL (ref 11.1–15.9)
Immature Grans (Abs): 0 10*3/uL (ref 0.0–0.1)
Immature Granulocytes: 0 %
Lymphocytes Absolute: 1.6 10*3/uL (ref 0.7–3.1)
Lymphs: 19 %
MCH: 31.3 pg (ref 26.6–33.0)
MCHC: 33.5 g/dL (ref 31.5–35.7)
MCV: 94 fL (ref 79–97)
Monocytes Absolute: 0.5 10*3/uL (ref 0.1–0.9)
Monocytes: 6 %
Neutrophils Absolute: 6.3 10*3/uL (ref 1.4–7.0)
Neutrophils: 74 %
Platelets: 318 10*3/uL (ref 150–450)
RBC: 4.31 x10E6/uL (ref 3.77–5.28)
RDW: 12.1 % (ref 11.7–15.4)
WBC: 8.4 10*3/uL (ref 3.4–10.8)

## 2022-01-02 LAB — COMP. METABOLIC PANEL (12)
AST: 9 IU/L (ref 0–40)
Albumin/Globulin Ratio: 1.9 (ref 1.2–2.2)
Albumin: 4.5 g/dL (ref 4.0–5.0)
Alkaline Phosphatase: 74 IU/L (ref 44–121)
BUN/Creatinine Ratio: 17 (ref 9–23)
BUN: 14 mg/dL (ref 6–20)
Bilirubin Total: 0.6 mg/dL (ref 0.0–1.2)
Calcium: 9.8 mg/dL (ref 8.7–10.2)
Chloride: 103 mmol/L (ref 96–106)
Creatinine, Ser: 0.83 mg/dL (ref 0.57–1.00)
Globulin, Total: 2.4 g/dL (ref 1.5–4.5)
Glucose: 78 mg/dL (ref 70–99)
Potassium: 4.1 mmol/L (ref 3.5–5.2)
Sodium: 140 mmol/L (ref 134–144)
Total Protein: 6.9 g/dL (ref 6.0–8.5)
eGFR: 98 mL/min/{1.73_m2} (ref >59)

## 2022-01-13 ENCOUNTER — Emergency Department (HOSPITAL_COMMUNITY)
Admission: EM | Admit: 2022-01-13 | Discharge: 2022-01-13 | Disposition: A | Discharge status: Home / Self Care | Payer: Commercial Managed Care - HMO | Attending: Emergency Medicine | Admitting: Emergency Medicine

## 2022-01-13 ENCOUNTER — Encounter (HOSPITAL_COMMUNITY): Payer: Self-pay

## 2022-01-13 DIAGNOSIS — N939 Abnormal uterine and vaginal bleeding, unspecified: Secondary | ICD-10-CM | POA: Insufficient documentation

## 2022-01-13 LAB — COMPREHENSIVE METABOLIC PANEL
ALT: 11 U/L (ref 0–44)
AST: 14 U/L — ABNORMAL LOW (ref 15–41)
Albumin: 3.8 g/dL (ref 3.5–5.0)
Alkaline Phosphatase: 45 U/L (ref 38–126)
Anion gap: 4 — ABNORMAL LOW (ref 5–15)
BUN: 12 mg/dL (ref 6–20)
CO2: 24 mmol/L (ref 22–32)
Calcium: 9.4 mg/dL (ref 8.9–10.3)
Chloride: 112 mmol/L — ABNORMAL HIGH (ref 98–111)
Creatinine, Ser: 0.77 mg/dL (ref 0.44–1.00)
GFR, Estimated: >60 mL/min (ref >60)
Glucose, Bld: 93 mg/dL (ref 70–99)
Potassium: 3.6 mmol/L (ref 3.5–5.1)
Sodium: 140 mmol/L (ref 135–145)
Total Bilirubin: 0.6 mg/dL (ref 0.3–1.2)
Total Protein: 7 g/dL (ref 6.5–8.1)

## 2022-01-13 LAB — I-STAT BETA HCG BLOOD, ED (MC, WL, AP ONLY): I-stat hCG, quantitative: <5 m[IU]/mL (ref <5)

## 2022-01-13 LAB — URINALYSIS, ROUTINE W REFLEX MICROSCOPIC
Bacteria, UA: NONE SEEN
Bilirubin Urine: NEGATIVE
Glucose, UA: NEGATIVE mg/dL
Ketones, ur: NEGATIVE mg/dL
Leukocytes,Ua: NEGATIVE
Nitrite: NEGATIVE
Protein, ur: NEGATIVE mg/dL
RBC / HPF: >50 RBC/hpf — ABNORMAL HIGH (ref 0–5)
Specific Gravity, Urine: 1.014 (ref 1.005–1.030)
pH: 8 (ref 5.0–8.0)

## 2022-01-13 LAB — CBC WITH DIFFERENTIAL/PLATELET
Abs Immature Granulocytes: 0.02 10*3/uL (ref 0.00–0.07)
Basophils Absolute: 0 10*3/uL (ref 0.0–0.1)
Basophils Relative: 0 %
Eosinophils Absolute: 0.1 10*3/uL (ref 0.0–0.5)
Eosinophils Relative: 1 %
HCT: 38.2 % (ref 36.0–46.0)
Hemoglobin: 12.8 g/dL (ref 12.0–15.0)
Immature Granulocytes: 0 %
Lymphocytes Relative: 20 %
Lymphs Abs: 1.6 10*3/uL (ref 0.7–4.0)
MCH: 31.6 pg (ref 26.0–34.0)
MCHC: 33.5 g/dL (ref 30.0–36.0)
MCV: 94.3 fL (ref 80.0–100.0)
Monocytes Absolute: 0.5 10*3/uL (ref 0.1–1.0)
Monocytes Relative: 7 %
Neutro Abs: 5.5 10*3/uL (ref 1.7–7.7)
Neutrophils Relative %: 72 %
Platelets: 297 10*3/uL (ref 150–400)
RBC: 4.05 MIL/uL (ref 3.87–5.11)
RDW: 12 % (ref 11.5–15.5)
WBC: 7.7 10*3/uL (ref 4.0–10.5)
nRBC: 0 % (ref 0.0–0.2)

## 2022-01-13 LAB — TYPE AND SCREEN
ABO/RH(D): O POS
Antibody Screen: NEGATIVE

## 2022-01-13 LAB — LIPASE, BLOOD: Lipase: 67 U/L — ABNORMAL HIGH (ref 11–51)

## 2022-01-13 MED ORDER — KETOROLAC TROMETHAMINE 30 MG/ML IJ SOLN
30.0000 mg | Freq: Once | INTRAMUSCULAR | Status: AC
  Administered 2022-01-13: 30 mg via INTRAMUSCULAR
  Filled 2022-01-13: qty 1

## 2022-01-13 MED ORDER — HYDROCODONE-ACETAMINOPHEN 5-325 MG PO TABS: 1.0000 | ORAL_TABLET | Freq: Four times a day (QID) | ORAL | 0 refills | Status: DC | PRN

## 2022-01-13 NOTE — ED Triage Notes (Signed)
Pt states she began having vaginal bleeding with large clots and lower abdominal cramping this morning. Pt has hx of PCOS and LMP unknown. Pt G1P0 with a previous miscarriage with similar symptoms approximately 10 years ago.

## 2022-01-13 NOTE — ED Provider Triage Note (Signed)
Emergency Medicine Provider Triage Evaluation Note  Monica Ramos , a 31 y.o. female  was evaluated in triage.  Pt complains of concerns for vaginal bleeding onset this morning.  Notes that she was recently sexually active with unprotected intercourse prior to the onset of her symptoms.  Has a history of PCOS and notes that her periods are irregular.  Has a history of a previous miscarriage with similar symptoms 10 years ago.  Has associated lower abdominal cramping.  Unsure of her last menstrual period.  Review of Systems  Positive:  Negative:   Physical Exam  BP (!) 136/101 (BP Location: Left Arm)   Pulse 81   Temp 98.1 F (36.7 C) (Oral)   Resp 18   LMP 12/31/2021 (Exact Date)   SpO2 99%  Gen:   Awake, no distress   Resp:  Normal effort  MSK:   Moves extremities without difficulty  Other:  Diffuse abdominal tenderness to palpation.  GU exam deferred in triage  Medical Decision Making  Medically screening exam initiated at 4:09 PM.  Appropriate orders placed.  Desirie Minteer was informed that the remainder of the evaluation will be completed by another provider, this initial triage assessment does not replace that evaluation, and the importance of remaining in the ED until their evaluation is complete.  Work-up initiated   Taesha Goodell A, PA-C 01/13/22 1612

## 2022-01-13 NOTE — ED Provider Notes (Signed)
[facility] DEPT Provider Note   CSN: [id number] Arrival date & time: 01/13/22  1555     History  Chief Complaint  Patient presents with   Vaginal Bleeding    Monica Ramos is a 31 y.o. female.  Patient complains of painful menses.  She has a history of heavy periods  The history is provided by the patient and medical records. No language interpreter was used.  Vaginal Bleeding Quality:  Dark red Severity:  Moderate Onset quality:  Sudden Timing:  Constant Progression:  Unable to specify Menstrual history:  Irregular Possible pregnancy: yes   Relieved by:  Nothing Associated symptoms: abdominal pain   Associated symptoms: no back pain and no fatigue        Home Medications Prior to Admission medications   Medication Sig Start Date End Date Taking? Authorizing Provider  HYDROcodone-acetaminophen (NORCO/VICODIN) 5-325 MG tablet Take 1 tablet by mouth every 6 (six) hours as needed for moderate pain. 01/13/22  Yes Milton Ferguson, MD  cyclobenzaprine (FLEXERIL) 10 MG tablet Take 1 tablet (10 mg total) by mouth 2 (two) times daily as needed for muscle spasms. 11/16/21   Francene Finders, PA-C  medroxyPROGESTERone (PROVERA) 10 MG tablet Take 1 tablet (10 mg total) by mouth daily. 01/01/22   Mayers, Cari S, PA-C  metFORMIN (GLUCOPHAGE) 500 MG tablet Take 1 tablet (500 mg total) by mouth 2 (two) times daily with a meal. 01/01/22   Mayers, Cari S, PA-C      Allergies    Patient has no known allergies.    Review of Systems   Review of Systems  Constitutional:  Negative for appetite change and fatigue.  HENT:  Negative for congestion, ear discharge and sinus pressure.   Eyes:  Negative for discharge.  Respiratory:  Negative for cough.   Cardiovascular:  Negative for chest pain.  Gastrointestinal:  Positive for abdominal pain. Negative for diarrhea.  Genitourinary:  Positive for vaginal bleeding. Negative for frequency and hematuria.  Musculoskeletal:   Negative for back pain.  Skin:  Negative for rash.  Neurological:  Negative for seizures and headaches.  Psychiatric/Behavioral:  Negative for hallucinations.     Physical Exam Updated Vital Signs BP 139/82   Pulse 73   Temp 98.1 F (36.7 C) (Oral)   Resp 18   LMP 12/31/2021 (Exact Date)   SpO2 100%  Physical Exam Vitals and nursing note reviewed.  Constitutional:      Appearance: She is well-developed.  HENT:     Head: Normocephalic.     Nose: Nose normal.  Eyes:     General: No scleral icterus.    Conjunctiva/sclera: Conjunctivae normal.  Neck:     Thyroid: No thyromegaly.  Cardiovascular:     Rate and Rhythm: Normal rate and regular rhythm.     Heart sounds: No murmur heard.    No friction rub. No gallop.  Pulmonary:     Breath sounds: No stridor. No wheezing or rales.  Chest:     Chest wall: No tenderness.  Abdominal:     General: There is no distension.     Tenderness: There is abdominal tenderness. There is no rebound.  Musculoskeletal:        General: Normal range of motion.     Cervical back: Neck supple.  Lymphadenopathy:     Cervical: No cervical adenopathy.  Skin:    Findings: No erythema or rash.  Neurological:     Mental Status: She is alert and oriented to  person, place, and time.     Motor: No abnormal muscle tone.     Coordination: Coordination normal.  Psychiatric:        Behavior: Behavior normal.     ED Results / Procedures / Treatments   Labs (all labs ordered are listed, but only abnormal results are displayed) Labs Reviewed  COMPREHENSIVE METABOLIC PANEL - Abnormal; Notable for the following components:      Result Value   Chloride 112 (*)    AST 14 (*)    Anion gap 4 (*)    All other components within normal limits  URINALYSIS, ROUTINE W REFLEX MICROSCOPIC - Abnormal; Notable for the following components:   Hgb urine dipstick MODERATE (*)    RBC / HPF >50 (*)    All other components within normal limits  LIPASE, BLOOD -  Abnormal; Notable for the following components:   Lipase 67 (*)    All other components within normal limits  CBC WITH DIFFERENTIAL/PLATELET  I-STAT BETA HCG BLOOD, ED ([facility], [facility], [facility] ONLY)  TYPE AND SCREEN  ABO/RH    EKG None  Radiology No results found.  Procedures Procedures    Medications Ordered in ED Medications  ketorolac (TORADOL) 30 MG/ML injection 30 mg (30 mg Intramuscular Given 01/13/22 1723)    ED Course/ Medical Decision Making/ A&P                           Medical Decision Making Risk Prescription drug management.  This patient presents to the ED for concern of painful menses, this involves an extensive number of treatment options, and is a complaint that carries with it a high risk of complications and morbidity.  The differential diagnosis includes irregular menses, fibroids, ectopic pregnancy   Co morbidities that complicate the patient evaluation  Painful menses   Additional history obtained:  Additional history obtained from the patient External records from outside source obtained and reviewed including hospital records   Lab Tests:  I Ordered, and personally interpreted labs.  The pertinent results include: CBC and chemistries unremarkable   Imaging Studies ordered: No imaging  Cardiac Monitoring: / EKG:  The patient was maintained on a cardiac monitor.  I personally viewed and interpreted the cardiac monitored which showed an underlying rhythm of: Normal sinus rhythm   Consultations Obtained:  No consultant  Problem List / ED Course / Critical interventions / Medication management  Painful menses I ordered medication including Toradol for pain Reevaluation of the patient after these medicines showed that the patient improved I have reviewed the patients home medicines and have made adjustments as needed   Social Determinants of Health:  None   Test / Admission - Considered:  She does not need admission.  She may need an  ultrasound of her pelvic area at some point  Patient with painful menses.  She improved with Toradol.  She is referred to OB/GYN and given Vicodin for pain       Final Clinical Impression(s) / ED Diagnoses Final diagnoses:  Vaginal bleeding    Rx / DC Orders ED Discharge Orders          Ordered    HYDROcodone-acetaminophen (NORCO/VICODIN) 5-325 MG tablet  Every 6 hours PRN        01/13/22 1816              Milton Ferguson, MD 01/18/22 1151

## 2022-01-13 NOTE — Discharge Instructions (Addendum)
Follow-up with the women's clinic that you have been referred to.  Take the pain medicine if Motrin does not help

## 2022-01-14 LAB — ABO/RH: ABO/RH(D): O POS

## 2022-01-28 ENCOUNTER — Encounter: Payer: Self-pay | Admitting: Obstetrics and Gynecology

## 2022-01-28 ENCOUNTER — Other Ambulatory Visit: Payer: Self-pay | Admitting: Obstetrics and Gynecology

## 2022-01-28 ENCOUNTER — Ambulatory Visit (INDEPENDENT_AMBULATORY_CARE_PROVIDER_SITE_OTHER): Payer: Self-pay | Admitting: Obstetrics and Gynecology

## 2022-01-28 ENCOUNTER — Other Ambulatory Visit: Payer: Self-pay

## 2022-01-28 VITALS — BP 142/87 | HR 79 | Ht 66.0 in | Wt 238.2 lb

## 2022-01-28 DIAGNOSIS — N939 Abnormal uterine and vaginal bleeding, unspecified: Secondary | ICD-10-CM

## 2022-01-28 DIAGNOSIS — E282 Polycystic ovarian syndrome: Secondary | ICD-10-CM

## 2022-01-28 DIAGNOSIS — Z01419 Encounter for gynecological examination (general) (routine) without abnormal findings: Secondary | ICD-10-CM

## 2022-01-28 MED ORDER — TRANEXAMIC ACID 650 MG PO TABS: ORAL_TABLET | ORAL | 2 refills | Status: DC

## 2022-01-28 MED ORDER — MEGESTROL ACETATE 40 MG PO TABS: ORAL_TABLET | ORAL | 5 refills | Status: DC

## 2022-01-28 NOTE — Progress Notes (Signed)
Ms Monica Ramos presents with c/o AUB. She has been bleeding for close to a month now. Was started on Provera in ED. This has not helped H/O PCOS and uterine fibroids Pt reports pap smear UTD H/O D & C for thicken endometrium and AUB a little over 1 yr ago. ? Hyperplasia but pt not sure. Was to get IUD after procedure but lost insurance  PE AF VSS Lungs clear Heart RRR Abd soft + BS GU deferred  A/P AUB        PCOS        Uterine fibroids  Discussed problems with pt. Would like to review pt's medical records. Pt will sign for release today. Will stop Provera. Start Megace 40 mg po bid. TXA for 5 days. Check GYN U/S. Refer to PCP for continue wt loss and insulin resistance management F/U in 3-4 weeks

## 2022-01-28 NOTE — Patient Instructions (Signed)
Diet for Polycystic Ovary Syndrome Polycystic ovary syndrome (PCOS) is a common hormonal disorder that affects a woman's reproductive system. It can cause problems with menstrual periods and make it hard to get and stay pregnant. Changing what you eat can help your hormones reach normal levels, improve your health, and help you better manage PCOS. Following a balanced diet can help you lose weight and improve the way that your body uses the hormone insulin to control blood sugar. This may include: Eating low-fat (lean) proteins, complex carbohydrates, fresh fruits and vegetables, low-fat dairy products, healthy fats, and fiber. Cutting down on calories. Exercising regularly. What are tips for following this plan? Follow a balanced diet for meals and snacks. Eat breakfast, lunch, dinner, and one or two snacks every day. Include protein in each meal and snack. Choose whole grains instead of products that are made with refined flour. Eat a variety of foods. Exercise regularly as told by your health care provider. Aim to do at least 30 minutes of exercise on most days of the week. If you are overweight or obese: Pay attention to how many calories you eat. Cutting down on calories can help you lose weight. Work with your health care provider or a dietitian to figure out how many calories you need each day. What foods should I eat?  Fruits Include a variety of colors and types. All fruits are helpful for PCOS. Vegetables Include a variety of colors and types. All vegetables are helpful for PCOS. Grains Whole grains, such as whole wheat. Whole-grain breads, crackers, cereals, and pasta. Unsweetened oatmeal. Bulgur, barley, quinoa, and brown rice. Tortillas made from corn or whole-wheat flour. Meats and other proteins Lean proteins, such as fish, chicken, beans, eggs, and tofu. Dairy Low-fat dairy products, such as skim milk, cheese sticks, and yogurt. Beverages Low-fat or fat-free drinks, such  as water, low-fat milk, sugar-free drinks, and small amounts of 100% fruit juice. Seasonings and condiments Ketchup. Mustard. Barbecue sauce. Relish. Low-fat or fat-free mayonnaise. Fats and oils Olive oil or canola oil. Walnuts and almonds. The items listed above may not be a complete list of recommended foods and beverages. Contact a dietitian for more options. What foods should I avoid? Foods that are high in calories or fat, especially saturated or trans fats. Fried foods. Sweets. Products that are made from refined white flour, including white bread, pastries, white rice, and pasta. The items listed above may not be a complete list of foods and beverages to avoid. Contact a dietitian for more information. Summary PCOS is a hormonal imbalance that affects a woman's reproductive system. It can cause problems with menstrual periods and make it hard to get and stay pregnant. You can help to manage your PCOS by exercising regularly and eating a healthy, varied diet of vegetables, fruit, whole grains, lean protein, and low-fat dairy products. Changing what you eat can improve the way that your body uses insulin, help your hormones reach normal levels, and help you lose weight. This information is not intended to replace advice given to you by your health care provider. Make sure you discuss any questions you have with your health care provider. Document Revised: 09/23/2019 Document Reviewed: 09/23/2019 Elsevier Patient Education  [employer].

## 2022-01-28 NOTE — Progress Notes (Signed)
Pt reports since 12/28/21 has been having irregular bleeding has PCOS, was told that she needed a IUD with progesterone.  Pt will sign ROI for pap records.

## 2022-01-29 MED ORDER — TRANEXAMIC ACID 650 MG PO TABS: ORAL_TABLET | ORAL | 2 refills | Status: DC

## 2022-01-29 NOTE — Addendum Note (Signed)
Addended by: Chancy Milroy on: 01/29/2022 12:18 PM   Modules accepted: Orders

## 2022-03-04 ENCOUNTER — Encounter: Payer: Self-pay | Admitting: Obstetrics and Gynecology

## 2022-03-04 DIAGNOSIS — N939 Abnormal uterine and vaginal bleeding, unspecified: Secondary | ICD-10-CM

## 2022-03-04 MED ORDER — TRANEXAMIC ACID 650 MG PO TABS: ORAL_TABLET | ORAL | 2 refills | Status: DC

## 2022-03-07 ENCOUNTER — Ambulatory Visit (HOSPITAL_COMMUNITY): Payer: Self-pay

## 2022-03-07 ENCOUNTER — Telehealth: Payer: Self-pay | Admitting: General Practice

## 2022-03-07 NOTE — Telephone Encounter (Signed)
Called patient regarding follow up GYN appt scheduled for tomorrow and discussed need for ultrasound prior to the appt tomorrow. Patient states she is unable to do an ultrasound today given the short notice and actually thought her appt tomorrow was for an ultrasound. Told patient no, it was a follow up appt with Dr Rip Harbour. Scheduled ultrasound for next week 11/15 @ 8am per patient preference. GYN appt will be rescheduled to sometime after ultrasound appt. Patient is aware of new plan of care and had no questions at this time.

## 2022-03-08 ENCOUNTER — Ambulatory Visit: Payer: Self-pay | Admitting: Obstetrics and Gynecology

## 2022-03-11 ENCOUNTER — Encounter: Payer: Self-pay | Admitting: *Deleted

## 2022-03-11 LAB — CYTOLOGY - PAP: Pap: NEGATIVE

## 2022-03-13 ENCOUNTER — Inpatient Hospital Stay: Admission: RE | Admit: 2022-03-13 | Payer: Self-pay | Source: Ambulatory Visit

## 2022-03-18 ENCOUNTER — Ambulatory Visit: Payer: Self-pay | Admitting: Obstetrics and Gynecology

## 2022-04-13 ENCOUNTER — Encounter: Payer: Self-pay | Admitting: Obstetrics and Gynecology

## 2022-04-17 ENCOUNTER — Ambulatory Visit: Payer: Commercial Managed Care - HMO | Admitting: Family Medicine

## 2022-04-30 ENCOUNTER — Ambulatory Visit: Payer: Self-pay

## 2022-05-28 ENCOUNTER — Other Ambulatory Visit: Payer: Self-pay

## 2022-05-28 ENCOUNTER — Encounter: Payer: Self-pay | Admitting: Physician Assistant

## 2022-05-28 ENCOUNTER — Ambulatory Visit: Payer: Self-pay | Admitting: Physician Assistant

## 2022-05-28 VITALS — BP 137/85 | HR 92 | Ht 66.0 in | Wt 234.0 lb

## 2022-05-28 DIAGNOSIS — N3000 Acute cystitis without hematuria: Secondary | ICD-10-CM

## 2022-05-28 LAB — POCT URINALYSIS DIP (CLINITEK)
Bilirubin, UA: NEGATIVE
Glucose, UA: NEGATIVE mg/dL
Ketones, POC UA: NEGATIVE mg/dL
Nitrite, UA: POSITIVE — AB
POC PROTEIN,UA: NEGATIVE
Spec Grav, UA: >=1.03 — AB (ref 1.010–1.025)
Urobilinogen, UA: 0.2 E.U./dL
pH, UA: 6 (ref 5.0–8.0)

## 2022-05-28 MED ORDER — NITROFURANTOIN MONOHYD MACRO 100 MG PO CAPS
100.0000 mg | ORAL_CAPSULE | Freq: Two times a day (BID) | ORAL | 0 refills | Status: AC
  Filled 2022-05-28: qty 10, 5d supply, fill #0

## 2022-05-28 NOTE — Progress Notes (Signed)
Established Patient Office Visit  Subjective   Patient ID: Monica Ramos, female    DOB: 26-Apr-1991  Age: 32 y.o. MRN: [id number]  Chief Complaint  Patient presents with   Dysuria    X3 days     States that she has been experiencing dysuria for the past 3 days.  Denies increased frequency, back pain, abdominal pain fever, nausea, vomiting.  States that she did start using Azo, first dose was approximately 20 minutes ago.    Past Medical History:  Diagnosis Date   ADHD (attention deficit hyperactivity disorder)    Asthma    Fibroid    Headache(784.0)    MRSA infection 2012   rt thigh   Polycystic ovarian syndrome    Ulcer    Urinary tract infection    Social History   Socioeconomic History   Marital status: Married    Spouse name: Not on file   Number of children: Not on file   Years of education: Not on file   Highest education level: Not on file  Occupational History   Not on file  Tobacco Use   Smoking status: Some Days    Types: Cigarettes   Smokeless tobacco: Never   Tobacco comments:    hooka  Vaping Use   Vaping Use: Never used  Substance and Sexual Activity   Alcohol use: No    Comment: rare   Drug use: No   Sexual activity: Yes    Birth control/protection: None  Other Topics Concern   Not on file  Social History Narrative   Not on file   Social Determinants of Health   Financial Resource Strain: Not on file  Food Insecurity: Not on file  Transportation Needs: Not on file  Physical Activity: Not on file  Stress: Not on file  Social Connections: Not on file  Intimate Partner Violence: Not on file   Family History  Problem Relation Age of Onset   Anesthesia problems Mother    Cancer Maternal Grandmother    Heart disease Maternal Grandfather    Diabetes Maternal Grandfather        leg amputee   Heart disease Paternal Uncle    Asthma Brother    Asthma Maternal Uncle    Diabetes Maternal Uncle    Hearing loss Maternal Uncle    Heart  disease Maternal Uncle    Hypertension Maternal Uncle    No Known Allergies  Review of Systems  Constitutional:  Negative for chills and fever.  HENT: Negative.    Eyes: Negative.   Respiratory:  Negative for shortness of breath.   Cardiovascular:  Negative for chest pain.  Gastrointestinal:  Negative for abdominal pain, nausea and vomiting.  Genitourinary:  Positive for dysuria. Negative for frequency, hematuria and urgency.  Musculoskeletal:  Negative for back pain.  Skin: Negative.   Neurological: Negative.   Endo/Heme/Allergies: Negative.   Psychiatric/Behavioral: Negative.        Objective:     BP 137/85 (BP Location: Left Arm, Patient Position: Sitting, Cuff Size: Large)   Pulse 92   Ht '5\' 6"'$  (1.676 m)   Wt 234 lb (106.1 kg)   SpO2 99%   BMI 37.77 kg/m    Physical Exam Vitals and nursing note reviewed.  Constitutional:      Appearance: Normal appearance.  HENT:     Head: Normocephalic and atraumatic.     Right Ear: External ear normal.     Left Ear: External ear normal.  Nose: Nose normal.     Mouth/Throat:     Mouth: Mucous membranes are moist.     Pharynx: Oropharynx is clear.  Eyes:     Extraocular Movements: Extraocular movements intact.     Conjunctiva/sclera: Conjunctivae normal.     Pupils: Pupils are equal, round, and reactive to light.  Cardiovascular:     Rate and Rhythm: Normal rate and regular rhythm.     Pulses: Normal pulses.     Heart sounds: Normal heart sounds.  Pulmonary:     Effort: Pulmonary effort is normal.     Breath sounds: Normal breath sounds.  Abdominal:     Tenderness: There is no abdominal tenderness. There is no right CVA tenderness or left CVA tenderness.  Musculoskeletal:        General: Normal range of motion.     Cervical back: Normal range of motion and neck supple.  Skin:    General: Skin is warm and dry.  Neurological:     General: No focal deficit present.     Mental Status: She is alert and oriented to  person, place, and time.  Psychiatric:        Mood and Affect: Mood normal.        Behavior: Behavior normal.        Thought Content: Thought content normal.        Judgment: Judgment normal.        Assessment & Plan:   Problem List Items Addressed This Visit   None Visit Diagnoses     Dysuria    -  Primary   Relevant Orders   Urine Culture   POCT URINALYSIS DIP (CLINITEK) (Completed)   Acute cystitis without hematuria       Relevant Medications   nitrofurantoin, macrocrystal-monohydrate, (MACROBID) 100 MG capsule     1. Acute cystitis without hematuria UA positive for UTI.  Trial Macrobid, patient education given on supportive care.  Red flags given for prompt reevaluation. - Urine Culture - POCT URINALYSIS DIP (CLINITEK) - nitrofurantoin, macrocrystal-monohydrate, (MACROBID) 100 MG capsule; Take 1 capsule (100 mg total) by mouth 2 (two) times daily for 5 days.  Dispense: 10 capsule; Refill: 0   I have reviewed the patient's medical history (PMH, PSH, Social History, Family History, Medications, and allergies) , and have been updated if relevant. I spent 20 minutes reviewing chart and  face to face time with patient.    Return if symptoms worsen or fail to improve.    Loraine Grip Mayers, PA-C

## 2022-05-28 NOTE — Patient Instructions (Signed)
Your urinalysis was positive for urinary tract infection.  You are going to take Macrobid twice daily for 5 days.  It is okay to continue using Azo as needed to help with discomfort.  Make sure you are drinking lots of fluids and getting plenty of rest.  We will call you with the result of your urine culture.  I hope that you feel better soon please let us know if there is anything else we can do for you.  Monica Rad, PA-C Physician Assistant [facility] [email or web address]   Urinary Tract Infection, Adult  A urinary tract infection (UTI) is an infection of any part of the urinary tract. The urinary tract includes the kidneys, ureters, bladder, and urethra. These organs make, store, and get rid of urine in the body. An upper UTI affects the ureters and kidneys. A lower UTI affects the bladder and urethra. What are the causes? Most urinary tract infections are caused by bacteria in your genital area around your urethra, where urine leaves your body. These bacteria grow and cause inflammation of your urinary tract. What increases the risk? You are more likely to develop this condition if: You have a urinary catheter that stays in place. You are not able to control when you urinate or have a bowel movement (incontinence). You are female and you: Use a spermicide or diaphragm for birth control. Have low estrogen levels. Are pregnant. You have certain genes that increase your risk. You are sexually active. You take antibiotic medicines. You have a condition that causes your flow of urine to slow down, such as: An enlarged prostate, if you are female. Blockage in your urethra. A kidney stone. A nerve condition that affects your bladder control (neurogenic bladder). Not getting enough to drink, or not urinating often. You have certain medical conditions, such as: Diabetes. A weak disease-fighting system (immunesystem). Sickle  cell disease. Gout. Spinal cord injury. What are the signs or symptoms? Symptoms of this condition include: Needing to urinate right away (urgency). Frequent urination. This may include small amounts of urine each time you urinate. Pain or burning with urination. Blood in the urine. Urine that smells bad or unusual. Trouble urinating. Cloudy urine. Vaginal discharge, if you are female. Pain in the abdomen or the lower back. You may also have: Vomiting or a decreased appetite. Confusion. Irritability or tiredness. A fever or chills. Diarrhea. The first symptom in older adults may be confusion. In some cases, they may not have any symptoms until the infection has worsened. How is this diagnosed? This condition is diagnosed based on your medical history and a physical exam. You may also have other tests, including: Urine tests. Blood tests. Tests for STIs (sexually transmitted infections). If you have had more than one UTI, a cystoscopy or imaging studies may be done to determine the cause of the infections. How is this treated? Treatment for this condition includes: Antibiotic medicine. Over-the-counter medicines to treat discomfort. Drinking enough water to stay hydrated. If you have frequent infections or have other conditions such as a kidney stone, you may need to see a health care provider who specializes in the urinary tract (urologist). In rare cases, urinary tract infections can cause sepsis. Sepsis is a life-threatening condition that occurs when the body responds to an infection. Sepsis is treated in the hospital with IV antibiotics, fluids, and other medicines. Follow these instructions at home:  Medicines Take over-the-counter and prescription medicines only as told by your health care provider. If you  were prescribed an antibiotic medicine, take it as told by your health care provider. Do not stop using the antibiotic even if you start to feel better. General  instructions Make sure you: Empty your bladder often and completely. Do not hold urine for long periods of time. Empty your bladder after sex. Wipe from front to back after urinating or having a bowel movement if you are female. Use each tissue only one time when you wipe. Drink enough fluid to keep your urine pale yellow. Keep all follow-up visits. This is important. Contact a health care provider if: Your symptoms do not get better after 1-2 days. Your symptoms go away and then return. Get help right away if: You have severe pain in your back or your lower abdomen. You have a fever or chills. You have nausea or vomiting. Summary A urinary tract infection (UTI) is an infection of any part of the urinary tract, which includes the kidneys, ureters, bladder, and urethra. Most urinary tract infections are caused by bacteria in your genital area. Treatment for this condition often includes antibiotic medicines. If you were prescribed an antibiotic medicine, take it as told by your health care provider. Do not stop using the antibiotic even if you start to feel better. Keep all follow-up visits. This is important. This information is not intended to replace advice given to you by your health care provider. Make sure you discuss any questions you have with your health care provider. Document Revised: 11/26/2019 Document Reviewed: 11/26/2019 Elsevier Patient Education  [place].

## 2022-05-30 ENCOUNTER — Other Ambulatory Visit: Payer: Self-pay

## 2022-05-31 LAB — URINE CULTURE

## 2022-06-07 ENCOUNTER — Encounter: Payer: Self-pay | Admitting: Obstetrics and Gynecology

## 2022-06-12 ENCOUNTER — Ambulatory Visit: Payer: Self-pay | Admitting: Obstetrics and Gynecology

## 2022-10-07 ENCOUNTER — Other Ambulatory Visit: Payer: Self-pay

## 2022-10-07 ENCOUNTER — Encounter: Payer: Self-pay | Admitting: Obstetrics and Gynecology

## 2022-10-07 DIAGNOSIS — E282 Polycystic ovarian syndrome: Secondary | ICD-10-CM

## 2022-10-07 DIAGNOSIS — N939 Abnormal uterine and vaginal bleeding, unspecified: Secondary | ICD-10-CM

## 2022-10-07 MED ORDER — MEGESTROL ACETATE 40 MG PO TABS: ORAL_TABLET | ORAL | 2 refills | Status: DC

## 2022-10-07 MED ORDER — TRANEXAMIC ACID 650 MG PO TABS: ORAL_TABLET | ORAL | 2 refills | Status: DC

## 2022-12-26 ENCOUNTER — Ambulatory Visit: Payer: Self-pay | Admitting: Obstetrics and Gynecology

## 2023-01-08 ENCOUNTER — Encounter: Payer: Self-pay | Admitting: Obstetrics and Gynecology

## 2023-01-09 ENCOUNTER — Ambulatory Visit: Payer: Self-pay | Admitting: Obstetrics and Gynecology

## 2023-01-24 ENCOUNTER — Other Ambulatory Visit: Payer: Self-pay | Admitting: Obstetrics and Gynecology

## 2023-01-24 DIAGNOSIS — E282 Polycystic ovarian syndrome: Secondary | ICD-10-CM

## 2023-01-24 DIAGNOSIS — N939 Abnormal uterine and vaginal bleeding, unspecified: Secondary | ICD-10-CM

## 2023-05-23 ENCOUNTER — Ambulatory Visit: Payer: Self-pay | Admitting: Obstetrics & Gynecology

## 2023-06-30 ENCOUNTER — Encounter (HOSPITAL_BASED_OUTPATIENT_CLINIC_OR_DEPARTMENT_OTHER): Payer: Self-pay

## 2023-06-30 ENCOUNTER — Other Ambulatory Visit: Payer: Self-pay

## 2023-06-30 ENCOUNTER — Emergency Department (HOSPITAL_BASED_OUTPATIENT_CLINIC_OR_DEPARTMENT_OTHER): Payer: Self-pay | Admitting: Radiology

## 2023-06-30 DIAGNOSIS — R0602 Shortness of breath: Secondary | ICD-10-CM | POA: Insufficient documentation

## 2023-06-30 DIAGNOSIS — Z5321 Procedure and treatment not carried out due to patient leaving prior to being seen by health care provider: Secondary | ICD-10-CM | POA: Insufficient documentation

## 2023-06-30 DIAGNOSIS — R059 Cough, unspecified: Secondary | ICD-10-CM | POA: Insufficient documentation

## 2023-06-30 DIAGNOSIS — R079 Chest pain, unspecified: Secondary | ICD-10-CM | POA: Insufficient documentation

## 2023-06-30 DIAGNOSIS — J45909 Unspecified asthma, uncomplicated: Secondary | ICD-10-CM | POA: Insufficient documentation

## 2023-06-30 LAB — BASIC METABOLIC PANEL
Anion gap: 8 (ref 5–15)
BUN: 13 mg/dL (ref 6–20)
CO2: 25 mmol/L (ref 22–32)
Calcium: 9.6 mg/dL (ref 8.9–10.3)
Chloride: 106 mmol/L (ref 98–111)
Creatinine, Ser: 0.74 mg/dL (ref 0.44–1.00)
GFR, Estimated: >60 mL/min (ref >60)
Glucose, Bld: 84 mg/dL (ref 70–99)
Potassium: 3.7 mmol/L (ref 3.5–5.1)
Sodium: 139 mmol/L (ref 135–145)

## 2023-06-30 LAB — RESP PANEL BY RT-PCR (RSV, FLU A&B, COVID)  RVPGX2
Influenza A by PCR: POSITIVE — AB
Influenza B by PCR: NEGATIVE
Resp Syncytial Virus by PCR: NEGATIVE
SARS Coronavirus 2 by RT PCR: NEGATIVE

## 2023-06-30 LAB — CBC
HCT: 38.2 % (ref 36.0–46.0)
Hemoglobin: 12.9 g/dL (ref 12.0–15.0)
MCH: 30.9 pg (ref 26.0–34.0)
MCHC: 33.8 g/dL (ref 30.0–36.0)
MCV: 91.6 fL (ref 80.0–100.0)
Platelets: 253 10*3/uL (ref 150–400)
RBC: 4.17 MIL/uL (ref 3.87–5.11)
RDW: 12.3 % (ref 11.5–15.5)
WBC: 7.7 10*3/uL (ref 4.0–10.5)
nRBC: 0 % (ref 0.0–0.2)

## 2023-06-30 LAB — TROPONIN I (HIGH SENSITIVITY): Troponin I (High Sensitivity): <2 ng/L (ref <18)

## 2023-06-30 NOTE — ED Triage Notes (Signed)
 Pt reports she is here today due to chest pain & sob that started around 4am. Pt reports it started with chest pain and then she started having sob. Pt reports h/o asthma. Pt reports productive cough

## 2023-07-01 ENCOUNTER — Emergency Department (HOSPITAL_BASED_OUTPATIENT_CLINIC_OR_DEPARTMENT_OTHER)
Admission: EM | Admit: 2023-07-01 | Discharge: 2023-07-01 | Discharge status: Against Medical Advice | Payer: Self-pay | Attending: Emergency Medicine | Admitting: Emergency Medicine

## 2023-07-01 NOTE — ED Notes (Signed)
 No answer when called to room

## 2023-10-16 ENCOUNTER — Encounter (HOSPITAL_BASED_OUTPATIENT_CLINIC_OR_DEPARTMENT_OTHER): Payer: Self-pay | Admitting: Emergency Medicine

## 2023-10-16 ENCOUNTER — Emergency Department (HOSPITAL_BASED_OUTPATIENT_CLINIC_OR_DEPARTMENT_OTHER)
Admission: EM | Admit: 2023-10-16 | Discharge: 2023-10-16 | Disposition: A | Discharge status: Home / Self Care | Attending: Emergency Medicine | Admitting: Emergency Medicine

## 2023-10-16 ENCOUNTER — Other Ambulatory Visit: Payer: Self-pay

## 2023-10-16 ENCOUNTER — Emergency Department (HOSPITAL_BASED_OUTPATIENT_CLINIC_OR_DEPARTMENT_OTHER): Admitting: Radiology

## 2023-10-16 DIAGNOSIS — M25561 Pain in right knee: Secondary | ICD-10-CM

## 2023-10-16 DIAGNOSIS — J45909 Unspecified asthma, uncomplicated: Secondary | ICD-10-CM | POA: Insufficient documentation

## 2023-10-16 DIAGNOSIS — S8981XA Other specified injuries of right lower leg, initial encounter: Secondary | ICD-10-CM | POA: Diagnosis not present

## 2023-10-16 DIAGNOSIS — F17210 Nicotine dependence, cigarettes, uncomplicated: Secondary | ICD-10-CM | POA: Insufficient documentation

## 2023-10-16 MED ORDER — IBUPROFEN 400 MG PO TABS
600.0000 mg | ORAL_TABLET | Freq: Once | ORAL | Status: AC
  Administered 2023-10-16: 600 mg via ORAL
  Filled 2023-10-16: qty 1

## 2023-10-16 NOTE — ED Provider Notes (Signed)
 [place] EMERGENCY DEPARTMENT AT [facility] Provider Note   CSN: [id number] Arrival date & time: 10/16/23  1221     Patient presents with: Knee Pain   Monica Ramos is a 33 y.o. female.    Knee Pain   33 year old female presents emergency department with complaints of right knee pain.  States that she was walking in [employer] and actually slipped on water that was on the ground fell forward landing directly on her right knee.  Has been able to ambulate since then but with pain in the right knee.  Denies trauma elsewhere.  Denies trauma to head, LOC, blood thinner use.  Has taken no medication for symptoms.  Presents emergency department for further assessment/evaluation.  Past medical history significant for fibroid, asthma, headache, PCOS, UTI  Prior to Admission medications   Medication Sig Start Date End Date Taking? Authorizing Provider  cyclobenzaprine  (FLEXERIL ) 10 MG tablet Take 1 tablet (10 mg total) by mouth 2 (two) times daily as needed for muscle spasms. Patient not taking: Reported on 05/28/2022 11/16/21   Vernestine Gondola, PA-C  HYDROcodone -acetaminophen  (NORCO/VICODIN) 5-325 MG tablet Take 1 tablet by mouth every 6 (six) hours as needed for moderate pain. Patient not taking: Reported on 05/28/2022 01/13/22   Zammit, Joseph, MD  megestrol  (MEGACE ) 40 MG tablet Take 1 tablet orally twice a day 10/07/22   Ervin, Michael L, MD  metFORMIN  (GLUCOPHAGE ) 500 MG tablet Take 1 tablet (500 mg total) by mouth 2 (two) times daily with a meal. Patient not taking: Reported on 05/28/2022 01/01/22   Mayers, Etter Hermann, PA-C  tranexamic acid  (LYSTEDA ) 650 MG TABS tablet Take 2 tablets every every 8 hours for 5 days 10/07/22   Ervin, Michael L, MD    Allergies: Patient has no known allergies.    Review of Systems  All other systems reviewed and are negative.   Updated Vital Signs BP 118/83 (BP Location: Left Wrist)   Pulse 86   Temp 98.7 F (37.1 C) (Oral)   Resp 18   Ht 5' 6  (1.676 m)   Wt 108.9 kg   SpO2 100%   BMI 38.74 kg/m   Physical Exam Vitals and nursing note reviewed.  Constitutional:      General: She is not in acute distress.    Appearance: She is well-developed.  HENT:     Head: Normocephalic and atraumatic.   Eyes:     Conjunctiva/sclera: Conjunctivae normal.    Cardiovascular:     Rate and Rhythm: Normal rate and regular rhythm.     Heart sounds: No murmur heard. Pulmonary:     Effort: Pulmonary effort is normal. No respiratory distress.     Breath sounds: Normal breath sounds. No wheezing, rhonchi or rales.  Abdominal:     Palpations: Abdomen is soft.     Tenderness: There is no abdominal tenderness.   Musculoskeletal:        General: No swelling.     Cervical back: Neck supple.     Comments: Full range of motion in right hip and knee ankle digits.  Pedal and posterior tibial pulses 2+ bilaterally.  Patient with tenderness over patella anteriorly as well as slight medial joint line tenderness.  No obvious joint laxity appreciated anterior/posterior drawer, varus/valgus stress.  No overlying erythema, palpable fluctuance/induration.  Patient able to ambulate but with limp.  Muscular strength 5 out of 5 bilateral extremities in flexion/extension.   Skin:    General: Skin is warm and dry.  Capillary Refill: Capillary refill takes less than 2 seconds.   Neurological:     Mental Status: She is alert.   Psychiatric:        Mood and Affect: Mood normal.     (all labs ordered are listed, but only abnormal results are displayed) Labs Reviewed - No data to display  EKG: None  Radiology: No results found.   Procedures   Medications Ordered in the ED - No data to display                                  Medical Decision Making Amount and/or Complexity of Data Reviewed Radiology: ordered.   This patient presents to the ED for concern of knee pain, this involves an extensive number of treatment options, and is a  complaint that carries with it a high risk of complications and morbidity.  The differential diagnosis includes fracture, strain/pain, dislocation, ligament/tendon injury, neurovascular compromise, septic arthritis, osteoarthritis, ischemic limb, DVT, other   Co morbidities that complicate the patient evaluation  See HPI   Additional history obtained:  Additional history obtained from EMR External records from outside source obtained and reviewed including hospital records   Lab Tests:  N/a   Imaging Studies ordered:  I ordered imaging studies including right knee strain I independently visualized and interpreted imaging which showed no acute osseous abnormality. I agree with the radiologist interpretation   Cardiac Monitoring: / EKG:  N/a   Consultations Obtained:  N/a   Problem List / ED Course / Critical interventions / Medication management  Right knee pain I ordered medication including Motrin    Reevaluation of the patient after these medicines showed that the patient improved I have reviewed the patients home medicines and have made adjustments as needed   Social Determinants of Health:  Cigarette use.  Denies illicit drug use.   Test / Admission - Considered:  Right knee pain Vitals signs within normal range and stable throughout visit. Imaging studies significant for: See above 33 year old female presents emergency department with complaints of right knee pain.  States that she was walking in [employer] and actually slipped on water that was on the ground fell forward landing directly on her right knee.  Has been able to ambulate since then but with pain in the right knee.  Denies trauma elsewhere.  Denies trauma to head, LOC, blood thinner use.  Has taken no medication for symptoms.  Presents emergency department for further assessment/evaluation. On exam, tenderness over patella as well as slight medial joint line tenderness.  No pulse deficits suggest  ischemic limb.  No overlying skin changes concerning for secondary infectious process.  No lower extremity edema concerning for DVT.  X-ray obtained did not show evidence of acute osseous abnormality.  Patient reassured by findings.  Placed in knee brace as well as given crutches to help aid in ambulation.  Recommend follow-up with PCP/orthopedics in the outpatient setting.  Recommend symptomatic therapy as described in AVS.  Treatment plan discussed with patient and she acknowledged understanding was agreeable to said plan.  Patient will well-appearing, afebrile in no acute distress. Worrisome signs and symptoms were discussed with the patient, and the patient acknowledged understanding to return to the ED if noticed. Patient was stable upon discharge.       Final diagnoses:  None    ED Discharge Orders     None  [place], [place] 10/16/23 1447

## 2023-10-16 NOTE — ED Notes (Signed)
 Pt provided crutches and R knee brace, education provided and pt verbalized understanding, no further questions. CNS intact prior to and after application. Pt d/c instructions, medications, and follow-up care reviewed with pt. Pt verbalized understanding and had no further questions at time of d/c. Pt CA&Ox4, ambulatory w/ supportive device, and in NAD at time off d/c.

## 2023-10-16 NOTE — Discharge Instructions (Signed)
 Your x-ray did not show any obvious fracture or dislocation.  Recommend continue use of anti-inflammatories at home such as ibuprofen  or Aleve as well as icing right knee.  Will place you in a knee brace as well as crutches to help offload your right knee. Attached is number for orthopedics to follow-up with.  Please do not hesitate to return to emergency department if the worrisome signs and symptoms were discussed become apparent.

## 2023-10-16 NOTE — ED Triage Notes (Signed)
 R knee pain since slipping and falling at [employer] today.

## 2023-10-30 ENCOUNTER — Ambulatory Visit: Admitting: Obstetrics and Gynecology

## 2023-10-30 ENCOUNTER — Other Ambulatory Visit: Payer: Self-pay

## 2023-10-30 ENCOUNTER — Encounter: Payer: Self-pay | Admitting: Obstetrics and Gynecology

## 2023-10-30 VITALS — BP 108/79 | HR 103 | Wt 263.4 lb

## 2023-10-30 DIAGNOSIS — E282 Polycystic ovarian syndrome: Secondary | ICD-10-CM | POA: Diagnosis not present

## 2023-10-30 DIAGNOSIS — N939 Abnormal uterine and vaginal bleeding, unspecified: Secondary | ICD-10-CM | POA: Diagnosis not present

## 2023-10-30 MED ORDER — MEDROXYPROGESTERONE ACETATE 10 MG PO TABS
10.0000 mg | ORAL_TABLET | Freq: Every day | ORAL | 2 refills | Status: AC
  Filled 2023-10-30: qty 10, 10d supply, fill #0

## 2023-10-30 NOTE — Patient Instructions (Signed)
 Take the full 10 days of pills unless you start bleeding  Get ultrasound and then will get labs ordered

## 2023-10-30 NOTE — Progress Notes (Signed)
    GYNECOLOGY VISIT  Patient name: Monica Ramos MRN [id number]  Date of birth: 1990-09-30 Chief Complaint:   Follow-up  History:  Monica Ramos is a 33 y.o. G0P0 being seen today for oligomenorrhea. Had spotting yesterday and went to lunch, there was nothing. Last actual mense was maybe 3-4 months ago, may have laste 2-3 days. Last pregnancy test was last week of May. Sexually active with female partenr. No prior pregnancy. Partner has 3 children. Previoulsy on megace  and TXA, stopped December 2024. Started bleeding July 2023 and started the medications in August and stehn stopped bleeding in October; prior to those medications was on provera  and did not like it because it made her bleed. 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  Notes having significant pain with EMB and does not want to do it in clinic, would prefer OR if it needs to be done again   Previously on metformin  + clomid  + provera   2023 NILM pap   Past Medical History:  Diagnosis Date   ADHD (attention deficit hyperactivity disorder)    Asthma    Fibroid    Headache(784.0)    MRSA infection 2012   rt thigh   Polycystic ovarian syndrome    Ulcer    Urinary tract infection     Past Surgical History:  Procedure Laterality Date   MYRINGOTOMY     WISDOM TOOTH EXTRACTION      The following portions of the patient's history were reviewed and updated as appropriate: allergies, current medications, past family history, past medical history, past social history, past surgical history and problem list.   Health Maintenance:   Last pap 2023 NILM Last mammogram: n/a   Review of Systems:  Pertinent items are noted in HPI. Comprehensive review of systems was otherwise negative.   Objective:  Physical Exam BP 108/79   Pulse (!) 103   Wt 263 lb 6.4 oz (119.5 kg)   LMP  (LMP Unknown)   BMI 42.51 kg/m    Physical Exam Vitals and nursing note reviewed.  Constitutional:       Appearance: Normal appearance.  HENT:     Head: Normocephalic and atraumatic.  Pulmonary:     Effort: Pulmonary effort is normal.  Skin:    General: Skin is warm and dry.  Neurological:     General: No focal deficit present.     Mental Status: She is alert.  Psychiatric:        Mood and Affect: Mood normal.        Behavior: Behavior normal.        Thought Content: Thought content normal.        Judgment: Judgment normal.       Assessment & Plan:   1. PCOS (polycystic ovarian syndrome) (Primary) 2. Abnormal uterine bleeding (AUB) Get US  done first see what the lining look like and then order tests pending US  results. Provera  prescribed to induce withdrawal bleed. If sampling needed, would plan for hysteroscopy D&C in the OR   - US  PELVIC COMPLETE WITH TRANSVAGINAL; Future - medroxyPROGESTERone  (PROVERA ) 10 MG tablet; Take 1 tablet (10 mg total) by mouth daily. Use for ten days to induce a period  Dispense: 10 tablet; Refill: 2   Routine preventative health maintenance measures emphasized.  Carter Quarry, MD [facility] for [facility], [facility]

## 2023-11-03 ENCOUNTER — Other Ambulatory Visit: Payer: Self-pay

## 2023-11-05 ENCOUNTER — Ambulatory Visit (HOSPITAL_COMMUNITY)
Admission: EM | Admit: 2023-11-05 | Discharge: 2023-11-05 | Disposition: A | Discharge status: Home / Self Care | Attending: Family Medicine | Admitting: Family Medicine

## 2023-11-05 ENCOUNTER — Other Ambulatory Visit: Payer: Self-pay

## 2023-11-05 DIAGNOSIS — M5442 Lumbago with sciatica, left side: Secondary | ICD-10-CM | POA: Diagnosis not present

## 2023-11-05 MED ORDER — DEXAMETHASONE SODIUM PHOSPHATE 10 MG/ML IJ SOLN
INTRAMUSCULAR | Status: AC
  Filled 2023-11-05: qty 1

## 2023-11-05 MED ORDER — METHOCARBAMOL 500 MG PO TABS
1000.0000 mg | ORAL_TABLET | Freq: Two times a day (BID) | ORAL | 0 refills | Status: DC | PRN
  Filled 2023-11-05: qty 20, 5d supply, fill #0

## 2023-11-05 MED ORDER — DEXAMETHASONE SODIUM PHOSPHATE 10 MG/ML IJ SOLN
10.0000 mg | Freq: Once | INTRAMUSCULAR | Status: AC
  Administered 2023-11-05: 10 mg via INTRAMUSCULAR

## 2023-11-05 NOTE — ED Provider Notes (Signed)
 [facility] CARE CENTER   [id number] 11/05/23 Arrival Time: 9074  ASSESSMENT & PLAN:  1. Acute left-sided low back pain with left-sided sciatica    Able to ambulate here and hemodynamically stable. No indication for imaging of back at this time given no trauma and normal neurological exam.   Trial of: Meds ordered this encounter  Medications   dexamethasone  (DECADRON ) injection 10 mg   methocarbamol  (ROBAXIN ) 500 MG tablet    Sig: Take 2 tablets (1,000 mg total) by mouth 2 (two) times daily as needed for muscle spasms.    Dispense:  20 tablet    Refill:  0   Work/school excuse note: provided. Medication sedation precautions given. Encourage ROM/movement as tolerated.  Recommend:  Follow-up Information     [facility].   Specialty: Urgent Care Why: If worsening or failing to improve as anticipated. Contact information: [address] [place]  [address] [phone]                Reviewed expectations re: course of current medical issues. Questions answered. Outlined signs and symptoms indicating need for more acute intervention. Patient verbalized understanding. After Visit Summary given.   SUBJECTIVE: History from: patient.  Monica Ramos is a 33 y.o. female who presents with complaint of intermittent low back pain; on LEFT; does radiate down back of LEFT leg. Denies extremity sensation changes or weakness. Normal bowel/bladder habits. Works at desk 8hr/day. Denis injury/trauma. No tx PTA.  OBJECTIVE:  Vitals:   11/05/23 1018  BP: 117/85  Pulse: 76  Resp: 17  Temp: 98.2 F (36.8 C)  TempSrc: Oral  SpO2: 96%    General appearance: alert; no distress HEENT: [place]; AT Neck: supple with FROM; without midline tenderness CV: regular Lungs: unlabored respirations; speaks full sentences without difficulty Abdomen: soft, non-tender; non-distended Back: mild  and poorly localized tenderness to palpation over left  lumbar/SI joint region; FROM at waist; bruising: none; without midline tenderness Extremities: without edema; symmetrical without gross deformities; normal ROM of bilateral LE Skin: warm and dry Neurologic: normal gait; normal sensation and strength of bilateral LE Psychological: alert and cooperative; normal mood and affect   No Known Allergies  Past Medical History:  Diagnosis Date   ADHD (attention deficit hyperactivity disorder)    Asthma    Fibroid    Headache(784.0)    MRSA infection 2012   rt thigh   Polycystic ovarian syndrome    Ulcer    Urinary tract infection    Social History   Socioeconomic History   Marital status: Legally Separated    Spouse name: Not on file   Number of children: Not on file   Years of education: Not on file   Highest education level: Not on file  Occupational History   Not on file  Tobacco Use   Smoking status: Some Days    Types: Cigarettes   Smokeless tobacco: Never   Tobacco comments:    hooka  Vaping Use   Vaping status: Never Used  Substance and Sexual Activity   Alcohol use: No    Comment: rare   Drug use: No   Sexual activity: Yes    Birth control/protection: None  Other Topics Concern   Not on file  Social History Narrative   Not on file   Social Drivers of Health   Financial Resource Strain: Not on file  Food Insecurity: Not on file  Transportation Needs: Not on file  Physical Activity: Not on file  Stress: Not on file  Social Connections: Not on file  Intimate Partner Violence: Not on file   Family History  Problem Relation Age of Onset   Anesthesia problems Mother    Cancer Maternal Grandmother    Heart disease Maternal Grandfather    Diabetes Maternal Grandfather        leg amputee   Heart disease Paternal Uncle    Asthma Brother    Asthma Maternal Uncle    Diabetes Maternal Uncle    Hearing loss Maternal Uncle    Heart disease Maternal Uncle    Hypertension Maternal Uncle    Past Surgical  History:  Procedure Laterality Date   MYRINGOTOMY     WISDOM TOOTH EXTRACTION        Rolinda Rogue, MD 11/05/23 1054

## 2023-11-05 NOTE — Discharge Instructions (Addendum)
 HOME CARE INSTRUCTIONS: For many people, back pain returns. Since low back pain is rarely dangerous, it is often a condition that people can learn to manage on their own. Please remain active. It is stressful on the back to sit or stand in one place. Do not sit, drive, or stand in one place for more than 30 minutes at a time. Take short walks on level surfaces as soon as pain allows. Try to increase the length of time you walk each day. Do not stay in bed. Resting more than 1 or 2 days can delay your recovery. Do not avoid exercise or work. Your body is made to move. It is not dangerous to be active, even though your back may hurt. Your back will likely heal faster if you return to being active before your pain is gone. Over-the-counter medicines to reduce pain and inflammation are often the most helpful.  SEEK MEDICAL CARE IF: You have pain that is not relieved with rest or medicine. You have pain that does not improve in 1 week. You have new symptoms. You are generally not feeling well.  SEEK IMMEDIATE MEDICAL CARE IF: You have pain that radiates from your back into your legs. You develop new bowel or bladder control problems. You have unusual weakness or numbness in your arms or legs. You develop nausea or vomiting. You develop abdominal pain. You feel faint.  Meds ordered this encounter  Medications   dexamethasone  (DECADRON ) injection 10 mg   methocarbamol  (ROBAXIN ) 500 MG tablet    Sig: Take 2 tablets (1,000 mg total) by mouth 2 (two) times daily as needed for muscle spasms.    Dispense:  20 tablet    Refill:  0

## 2023-11-05 NOTE — ED Triage Notes (Addendum)
 Since past Sunday c/o lower back pain pain radiating down left leg. Took ibuprofen  and icy patch on back with no relief. Pain rated 10/10 at present. Pt stated she fell a week ago but landed on her knee and did not associate back pain with the fall.

## 2023-11-07 ENCOUNTER — Encounter: Payer: Self-pay | Admitting: Physician Assistant

## 2023-11-07 ENCOUNTER — Ambulatory Visit: Admitting: Physician Assistant

## 2023-11-07 ENCOUNTER — Other Ambulatory Visit: Payer: Self-pay

## 2023-11-07 ENCOUNTER — Ambulatory Visit (HOSPITAL_COMMUNITY)
Admission: RE | Admit: 2023-11-07 | Discharge: 2023-11-07 | Disposition: A | Discharge status: Home / Self Care | Source: Ambulatory Visit | Attending: Obstetrics and Gynecology

## 2023-11-07 ENCOUNTER — Ambulatory Visit (INDEPENDENT_AMBULATORY_CARE_PROVIDER_SITE_OTHER): Payer: Self-pay

## 2023-11-07 DIAGNOSIS — N939 Abnormal uterine and vaginal bleeding, unspecified: Secondary | ICD-10-CM | POA: Diagnosis not present

## 2023-11-07 DIAGNOSIS — E282 Polycystic ovarian syndrome: Secondary | ICD-10-CM | POA: Diagnosis not present

## 2023-11-07 DIAGNOSIS — D252 Subserosal leiomyoma of uterus: Secondary | ICD-10-CM | POA: Diagnosis not present

## 2023-11-07 DIAGNOSIS — M545 Low back pain, unspecified: Secondary | ICD-10-CM | POA: Diagnosis not present

## 2023-11-07 MED ORDER — METHYLPREDNISOLONE 4 MG PO TBPK
ORAL_TABLET | ORAL | 0 refills | Status: AC
  Filled 2023-11-07: qty 21, 6d supply, fill #0

## 2023-11-07 NOTE — Progress Notes (Signed)
 Office Visit Note   Patient: Monica Ramos           Date of Birth: [birth year]           MRN: [id number] Visit Date: 11/07/2023              Requested by: No referring provider defined for this encounter. PCP: Pcp, No   Assessment & Plan: Visit Diagnoses:  1. Acute left-sided low back pain, unspecified whether sciatica present     Plan: Patient is a pleasant 33 year old woman who comes in today with a chief complaint of low back pain radiating down her left posterior leg to about her knee.  She been going on since last Sunday she recently got a new bed was not sure if it was related to that she has had a history of intermittent back pain in the past but has not lasted this long.  Hurts especially when she bends over.  She did go to an urgent care who gave her a shot of steroid it helped her for short period of time she has taken muscle relaxers which just make her sleepy.  She denies any loss of bowel or bladder control.  X-rays are not do not show any osseous abnormalities.  She does have some radicular findings going down the back of her left leg however her strength is quite good.  We talked about the natural history of this I would like her to stop the ibuprofen  and any other anti-inflammatories and we will try her on a Medrol  Dosepak.  Will also refer her to physical therapy.  Would like for her to follow-up with me in 6 weeks if she still having problems we could consider an MRI.  Told her to call me sooner if she has escalating symptoms  Follow-Up Instructions: Return in about 6 weeks (around 12/19/2023).   Orders:  Orders Placed This Encounter  Procedures   XR Lumbar Spine 2-3 Views   Ambulatory referral to Physical Therapy   No orders of the defined types were placed in this encounter.     Procedures: No procedures performed   Clinical Data: No additional findings.   Subjective: Chief Complaint  Patient presents with   Lower Back - Pain    HPI pleasant 33 year old  woman with a history of about a weeks of low back pain radiating down her left leg.  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  Pain goes down the back of her leg was seen at an urgent care where they gave her shot which helped but not very much denies any paresthesias she has had intermittent back pain on and off for a while.  She did have a recent fall at [employer] last month  Review of Systems  All other systems reviewed and are negative.    Objective: Vital Signs: LMP  (LMP Unknown)   Physical Exam Constitutional:      Appearance: Normal appearance.  Pulmonary:     Effort: Pulmonary effort is normal.  Skin:    General: Skin is warm and dry.  Neurological:     General: No focal deficit present.     Mental Status: She is alert and oriented to person, place, and time.  Psychiatric:        Mood and Affect: Mood normal.        Behavior: Behavior normal.     Ortho Exam Examination of her lower back she does have reproducible pain in the  lower back with flexing.  No step-offs or abnormalities.  Extension causes a little bit of pain as well.  She has excellent strength with dorsiflexion plantarflexion extension flexion of her legs and sensation is in tact.  No pain with manipulation of her hips.  No pain over the lateral side of her hip.  Slightly positive straight leg raise Specialty Comments:  No specialty comments available.  Imaging: XR Lumbar Spine 2-3 Views Result Date: 11/07/2023 Three-view radiographs of her lumbar spine demonstrate no evidence of listhesis well-preserved joint spacing.  No evidence of fracture.    PMFS History: Patient Active Problem List   Diagnosis Date Noted   Visit for routine gyn exam 01/28/2022   Abnormal uterine bleeding (AUB) 01/28/2022   Insulin resistance 01/01/2022   Class 3 severe obesity due to excess calories with body mass index (BMI) of 40.0 to 44.9 in adult 01/01/2022   Secondary amenorrhea 12/27/2016   PCOS  (polycystic ovarian syndrome) 12/27/2016   Past Medical History:  Diagnosis Date   ADHD (attention deficit hyperactivity disorder)    Asthma    Fibroid    Headache(784.0)    MRSA infection 2012   rt thigh   Polycystic ovarian syndrome    Ulcer    Urinary tract infection     Family History  Problem Relation Age of Onset   Anesthesia problems Mother    Cancer Maternal Grandmother    Heart disease Maternal Grandfather    Diabetes Maternal Grandfather        leg amputee   Heart disease Paternal Uncle    Asthma Brother    Asthma Maternal Uncle    Diabetes Maternal Uncle    Hearing loss Maternal Uncle    Heart disease Maternal Uncle    Hypertension Maternal Uncle     Past Surgical History:  Procedure Laterality Date   MYRINGOTOMY     WISDOM TOOTH EXTRACTION     Social History   Occupational History   Not on file  Tobacco Use   Smoking status: Some Days    Types: Cigarettes   Smokeless tobacco: Never   Tobacco comments:    hooka  Vaping Use   Vaping status: Never Used  Substance and Sexual Activity   Alcohol use: No    Comment: rare   Drug use: No   Sexual activity: Yes    Birth control/protection: None

## 2023-11-12 ENCOUNTER — Ambulatory Visit: Payer: Self-pay | Admitting: Obstetrics and Gynecology

## 2023-11-15 ENCOUNTER — Ambulatory Visit: Admitting: Physical Therapy

## 2023-12-01 ENCOUNTER — Telehealth: Payer: Self-pay | Admitting: *Deleted

## 2023-12-01 NOTE — Telephone Encounter (Signed)
 Patient called to inquire about her bleeding.  She is taking Provera  to induce a period and she started bleeding about 4 days ago.  This morning, she wore an ultra tampon to work and bled through it within 2 1/2 hours. Returned call and discovered that the patient is now bleeding less than she was this morning when she first got out of bed.  Reassured her that it is normal to feel a small gush of blood along with a cramping sensation; recommended Ibuprofen  and to wear her maxi pads that she stated she wears at home so that she can monitor her bleeding more accurately and if she saturates her pad 3 times in 3 hours then she should go to the ED.  No further questions or concerns from patient.

## 2023-12-04 ENCOUNTER — Ambulatory Visit: Payer: Self-pay

## 2023-12-04 ENCOUNTER — Other Ambulatory Visit: Payer: Self-pay

## 2023-12-04 ENCOUNTER — Emergency Department (HOSPITAL_COMMUNITY)
Admission: EM | Admit: 2023-12-04 | Discharge: 2023-12-04 | Disposition: A | Discharge status: Home / Self Care | Attending: Emergency Medicine | Admitting: Emergency Medicine

## 2023-12-04 ENCOUNTER — Encounter (HOSPITAL_COMMUNITY): Payer: Self-pay

## 2023-12-04 DIAGNOSIS — M5432 Sciatica, left side: Secondary | ICD-10-CM | POA: Diagnosis not present

## 2023-12-04 DIAGNOSIS — M5442 Lumbago with sciatica, left side: Secondary | ICD-10-CM | POA: Insufficient documentation

## 2023-12-04 DIAGNOSIS — M545 Low back pain, unspecified: Secondary | ICD-10-CM | POA: Diagnosis present

## 2023-12-04 MED ORDER — METHOCARBAMOL 500 MG PO TABS: 500.0000 mg | ORAL_TABLET | Freq: Two times a day (BID) | ORAL | 0 refills | Status: DC

## 2023-12-04 MED ORDER — IBUPROFEN 600 MG PO TABS: 600.0000 mg | ORAL_TABLET | Freq: Three times a day (TID) | ORAL | 0 refills | Status: AC | PRN

## 2023-12-04 MED ORDER — PREDNISONE 20 MG PO TABS
60.0000 mg | ORAL_TABLET | Freq: Once | ORAL | Status: AC
  Administered 2023-12-04: 60 mg via ORAL
  Filled 2023-12-04: qty 3

## 2023-12-04 MED ORDER — METHOCARBAMOL 500 MG PO TABS
500.0000 mg | ORAL_TABLET | Freq: Once | ORAL | Status: AC
Start: 2023-12-04 — End: 2023-12-04
  Administered 2023-12-04: 500 mg via ORAL
  Filled 2023-12-04: qty 1

## 2023-12-04 MED ORDER — PREDNISONE 20 MG PO TABS: ORAL_TABLET | ORAL | 0 refills | Status: AC

## 2023-12-04 MED ORDER — KETOROLAC TROMETHAMINE 15 MG/ML IJ SOLN
15.0000 mg | Freq: Once | INTRAMUSCULAR | Status: AC
Start: 2023-12-04 — End: 2023-12-04
  Administered 2023-12-04: 15 mg via INTRAMUSCULAR
  Filled 2023-12-04: qty 1

## 2023-12-04 MED ORDER — HYDROCODONE-ACETAMINOPHEN 5-325 MG PO TABS
1.0000 | ORAL_TABLET | Freq: Once | ORAL | Status: AC
  Administered 2023-12-04: 1 via ORAL
  Filled 2023-12-04: qty 1

## 2023-12-04 NOTE — ED Provider Notes (Signed)
 [place] EMERGENCY DEPARTMENT AT [facility] Provider Note   CSN: [id number] Arrival date & time: 12/04/23  9042     Patient presents with: Back Pain and Leg Pain   Monica Ramos is a 33 y.o. female.   Monica Ramos is a 33 y.o. female with history of sciatica, who presents to the emergency department for occasional back pain she reports she has had issues for the past 6 months, went to urgent care a few weeks ago and was diagnosed with sciatica was prescribed steroids and initially had some relief. The patient reports that the past few days pain has worsened again starting in the left low back and radiating down the left leg worse with certain movements.  No loss of bowel or bladder control or saddle anesthesia.  No new trauma or injury to the area.  Patient reports she has used the prescribed muscle relaxers but only takes a half of a 5 mg tablet because she is not convinced about taking more medication is also used some Tylenol  intermittently.  Has a follow-up appointment coming up with orthopedics but did not know what to do about worsening pain in the meantime.   Back Pain Associated symptoms: leg pain   Associated symptoms: no abdominal pain, no chest pain, no dysuria, no fever, no numbness and no weakness   Leg Pain Associated symptoms: back pain   Associated symptoms: no fever and no neck pain        Prior to Admission medications   Medication Sig Start Date End Date Taking? Authorizing Provider  ibuprofen  (ADVIL ) 800 MG tablet Take 800 mg by mouth every 8 (eight) hours as needed.    [provider]  medroxyPROGESTERone  (PROVERA ) 10 MG tablet Take 1 tablet (10 mg total) by mouth daily. Use for ten days to induce a period 10/30/23   Ajewole, Christana, MD  methocarbamol  (ROBAXIN ) 500 MG tablet Take 2 tablets (1,000 mg total) by mouth 2 (two) times daily as needed for muscle spasms. 11/05/23   Rolinda Rogue, MD    Allergies: Patient has no known allergies.     Review of Systems  Constitutional:  Negative for chills and fever.  HENT: Negative.    Respiratory:  Negative for shortness of breath.   Cardiovascular:  Negative for chest pain.  Gastrointestinal:  Negative for abdominal pain, constipation, diarrhea, nausea and vomiting.  Genitourinary:  Negative for dysuria, flank pain, frequency and hematuria.  Musculoskeletal:  Positive for back pain. Negative for arthralgias, gait problem, joint swelling, myalgias and neck pain.  Skin:  Negative for color change, rash and wound.  Neurological:  Negative for weakness and numbness.    Updated Vital Signs BP (!) 137/90   Pulse (!) 104   Temp 98.6 F (37 C) (Oral)   Resp 18   Ht 5' 6 (1.676 m)   Wt 97.5 kg   SpO2 97%   BMI 34.70 kg/m   Physical Exam Vitals and nursing note reviewed.  Constitutional:      General: She is not in acute distress.    Appearance: She is well-developed. She is not diaphoretic.  HENT:     Head: Atraumatic.  Eyes:     General:        Right eye: No discharge.        Left eye: No discharge.  Cardiovascular:     Pulses:          Radial pulses are 2+ on the right side and 2+ on the  left side.       Dorsalis pedis pulses are 2+ on the right side and 2+ on the left side.       Posterior tibial pulses are 2+ on the right side and 2+ on the left side.  Pulmonary:     Effort: Pulmonary effort is normal. No respiratory distress.  Abdominal:     General: Bowel sounds are normal. There is no distension.     Palpations: Abdomen is soft. There is no mass.     Tenderness: There is no abdominal tenderness. There is no guarding.     Comments: Abdomen soft, nondistended, nontender to palpation in all quadrants without guarding or peritoneal signs, no CVA tenderness bilaterally  Musculoskeletal:     Cervical back: Neck supple.     Comments: Tenderness to palpation over left low back.  Pain made worse with range of motion of the lower extremities, positive straight leg  raise on the left.  Skin:    General: Skin is warm and dry.     Capillary Refill: Capillary refill takes less than 2 seconds.  Neurological:     Mental Status: She is alert and oriented to person, place, and time.     Comments: Alert, clear speech, following commands. Moving all extremities without difficulty. Bilateral lower extremities with 5/5 strength in proximal and distal muscle groups and with dorsi and plantar flexion. Sensation intact in bilateral lower extremities. 2+ patellar DTRs bilaterally. Ambulatory with steady gait  Psychiatric:        Behavior: Behavior normal.     (all labs ordered are listed, but only abnormal results are displayed) Labs Reviewed - No data to display  EKG: None  Radiology: No results found.   Procedures   Medications Ordered in the ED  HYDROcodone -acetaminophen  (NORCO/VICODIN) 5-325 MG per tablet 1 tablet (1 tablet Oral Given 12/04/23 1146)  methocarbamol  (ROBAXIN ) tablet 500 mg (500 mg Oral Given 12/04/23 1145)  ketorolac  (TORADOL ) 15 MG/ML injection 15 mg (15 mg Intramuscular Given 12/04/23 1145)  predniSONE  (DELTASONE ) tablet 60 mg (60 mg Oral Given 12/04/23 1145)                                    Medical Decision Making Risk Prescription drug management.   Patient presents to the ED with complaints of  back pain.  Nontoxic, vitals WNL.   Additional history obtained:  Additional history obtained from chart review & nursing note review.   Lab Tests:  I Ordered, reviewed, and interpreted labs, which included:     ED Course:  Patient has no back pain red flags.  No neurologic deficits, ambulatory- doubt cauda equina syndrome or acute cord compression. Afebrile, no hx of IVDU- doubt epidural abscess. No urinary sxs- feel that UTI/nephrolithiasis. Symmetric pulses, not a tearing sensation, overall well appearing, feel that dissection is unlikely at this time. Will treat with steroids and Robaxin , discussed with patient that they are  not to drive or operate heavy machinery while taking Robaxin . I discussed treatment plan, need for ortho follow-up, and return precautions with the patient. Provided opportunity for questions, patient confirmed understanding and is in agreement with plan.   Portions of this note were generated with Scientist, clinical (histocompatibility and immunogenetics). Dictation errors may occur despite best attempts at proofreading.       Final diagnoses:  Sciatica of left side    ED Discharge Orders     None  Alva Larraine FALCON, PA-C 12/10/23 1117    Tegeler, Lonni PARAS, MD 12/10/23 1236

## 2023-12-04 NOTE — ED Triage Notes (Signed)
 Pt coming from home for c/o back pain. Pt went to UC a few weeks ago and was diagnosed with sciatica. Pt was prescribed steroids and had some relief. Pt reports going to bathroom today and had sudden pain radiating down L leg and it feels like her bones and rubbing together. She states this pain is worse than a few weeks ago.

## 2023-12-04 NOTE — Discharge Instructions (Addendum)
 Suspect pain is due to an flareup of your sciatica.  Take prescribed medications as directed and follow-up with orthopedics as planned.  Use the sciatica exercises on your paperwork to help with symptoms as well.

## 2023-12-18 ENCOUNTER — Telehealth: Payer: Self-pay | Admitting: Physician Assistant

## 2023-12-18 ENCOUNTER — Other Ambulatory Visit: Payer: Self-pay

## 2023-12-18 ENCOUNTER — Emergency Department (HOSPITAL_COMMUNITY)
Admission: EM | Admit: 2023-12-18 | Discharge: 2023-12-18 | Discharge status: Against Medical Advice | Attending: Emergency Medicine | Admitting: Emergency Medicine

## 2023-12-18 ENCOUNTER — Emergency Department (HOSPITAL_COMMUNITY)

## 2023-12-18 DIAGNOSIS — R0789 Other chest pain: Secondary | ICD-10-CM | POA: Insufficient documentation

## 2023-12-18 DIAGNOSIS — Z5321 Procedure and treatment not carried out due to patient leaving prior to being seen by health care provider: Secondary | ICD-10-CM | POA: Diagnosis not present

## 2023-12-18 DIAGNOSIS — M79602 Pain in left arm: Secondary | ICD-10-CM | POA: Insufficient documentation

## 2023-12-18 DIAGNOSIS — R079 Chest pain, unspecified: Secondary | ICD-10-CM | POA: Diagnosis not present

## 2023-12-18 LAB — BASIC METABOLIC PANEL WITH GFR
Anion gap: 13 (ref 5–15)
BUN: 9 mg/dL (ref 6–20)
CO2: 23 mmol/L (ref 22–32)
Calcium: 9.4 mg/dL (ref 8.9–10.3)
Chloride: 103 mmol/L (ref 98–111)
Creatinine, Ser: 0.66 mg/dL (ref 0.44–1.00)
GFR, Estimated: >60 mL/min (ref >60)
Glucose, Bld: 111 mg/dL — ABNORMAL HIGH (ref 70–99)
Potassium: 3.6 mmol/L (ref 3.5–5.1)
Sodium: 139 mmol/L (ref 135–145)

## 2023-12-18 LAB — CBC
HCT: 36.5 % (ref 36.0–46.0)
Hemoglobin: 12.2 g/dL (ref 12.0–15.0)
MCH: 31 pg (ref 26.0–34.0)
MCHC: 33.4 g/dL (ref 30.0–36.0)
MCV: 92.9 fL (ref 80.0–100.0)
Platelets: 304 K/uL (ref 150–400)
RBC: 3.93 MIL/uL (ref 3.87–5.11)
RDW: 13 % (ref 11.5–15.5)
WBC: 9.4 K/uL (ref 4.0–10.5)
nRBC: 0 % (ref 0.0–0.2)

## 2023-12-18 LAB — TROPONIN I (HIGH SENSITIVITY): Troponin I (High Sensitivity): 3 ng/L (ref <18)

## 2023-12-18 NOTE — ED Notes (Signed)
 Patient called for repeat labs no answer

## 2023-12-18 NOTE — ED Triage Notes (Addendum)
 Pt to ER via EMS from work.  Pt began having left sided chest pressure around 1045 that radiated to left shoulder.  Pt denies other s/s at this time.  States shoulder pain has subsided.  Describes pain as a pressure. Pt given 324 mg ASA and 1 SL NTG en route with no change.

## 2023-12-18 NOTE — ED Provider Triage Note (Signed)
 Emergency Medicine Provider Triage Evaluation Note  Monica Ramos , a 33 y.o. female  was evaluated in triage.  Pt complains of left arm pain which began while she was at work now extending up to her chest and back.  Underlying PCOS and hypertension and does vape.  Family history of CAD.  Reports I googled this and they said I might be having an MI .  No shortness of breath associated.  Did not take any medication for improvement in symptoms..  Review of Systems  Positive: Chest pain Negative: Shortness of breath, fever  Physical Exam  BP 127/88   Pulse (!) 105   Temp 98.5 F (36.9 C)   Resp 18   Ht 5' 6 (1.676 m)   Wt 113.4 kg   SpO2 95%   BMI 40.35 kg/m  Gen:   Awake, no distress   Resp:  Normal effort  MSK:   Moves extremities without difficulty  Other:    Medical Decision Making  Medically screening exam initiated at 3:17 PM.  Appropriate orders placed.  Dareth Andrew was informed that the remainder of the evaluation will be completed by another provider, this initial triage assessment does not replace that evaluation, and the importance of remaining in the ED until their evaluation is complete.     Sima Lindenberger, PA-C 12/18/23 1517

## 2023-12-18 NOTE — Telephone Encounter (Signed)
 Called pt left vm for pt to reschedule appt with Ronal Caldron. Please transfer call to [place] when pt returns call

## 2023-12-18 NOTE — ED Notes (Signed)
 Pt called foe trop x 2  no answer rn maggie aware at

## 2023-12-23 ENCOUNTER — Telehealth: Payer: Self-pay

## 2023-12-23 NOTE — Telephone Encounter (Signed)
 LVM to call office to change appointment

## 2023-12-26 ENCOUNTER — Ambulatory Visit: Admitting: Physician Assistant

## 2024-01-05 ENCOUNTER — Encounter: Payer: Self-pay | Admitting: Physician Assistant

## 2024-01-05 ENCOUNTER — Ambulatory Visit (INDEPENDENT_AMBULATORY_CARE_PROVIDER_SITE_OTHER): Admitting: Physician Assistant

## 2024-01-05 DIAGNOSIS — M5442 Lumbago with sciatica, left side: Secondary | ICD-10-CM

## 2024-01-05 DIAGNOSIS — G8929 Other chronic pain: Secondary | ICD-10-CM

## 2024-01-05 DIAGNOSIS — M545 Low back pain, unspecified: Secondary | ICD-10-CM | POA: Insufficient documentation

## 2024-01-05 MED ORDER — METHOCARBAMOL 500 MG PO TABS: 500.0000 mg | ORAL_TABLET | Freq: Two times a day (BID) | ORAL | 0 refills | Status: DC

## 2024-01-05 MED ORDER — TRAMADOL HCL 50 MG PO TABS: 50.0000 mg | ORAL_TABLET | Freq: Four times a day (QID) | ORAL | 0 refills | Status: AC | PRN

## 2024-01-05 NOTE — Progress Notes (Signed)
 Office Visit Note   Patient: Monica Ramos           Date of Birth: June 22, 1990           MRN: [id number] Visit Date: 01/05/2024              Requested by: No referring provider defined for this encounter. PCP: Pcp, No  No chief complaint on file.     HPI: Patient is a pleasant 33 year old woman who have seen in the past for low back pain radiating down her left leg.  She has been treated with a steroid taper as well as muscle relaxants.  She also has been taking ibuprofen .  She had some relief with steroid and muscle relaxants help but she continues to have low back pain that radiates down her left leg no loss of bowel or bladder control  Assessment & Plan: Visit Diagnoses:  1. Chronic bilateral low back pain with left-sided sciatica     Plan: I had a long discussion with her today for her strength is still intact I would like her to try to reengage with physical therapy.  She said she unfortunately lost the phone number so was unable to call them to reschedule an appointment.  She is willing to do this.  Will give her a few tramadol  to take at night as she is having difficulty sleeping and a refill of her muscle relaxant.  If she does not get relief with therapy should contact us  neck step would be to order an MRI  Follow-Up Instructions: No follow-ups on file.   Ortho Exam  Patient is alert, oriented, no adenopathy, well-dressed, normal affect, normal respiratory effort. Examination of her lower back she has pain with extension and flexion.  She has a positive straight leg raise on the left good strength with range of motion of her leg no pain with manipulation of her left hip    Imaging: No results found. No images are attached to the encounter.  Labs: Lab Results  Component Value Date   REPTSTATUS 04/16/2017 FINAL 04/13/2017   CULT >=100,000 COLONIES/mL ESCHERICHIA COLI (A) 04/13/2017   LABORGA ESCHERICHIA COLI (A) 04/13/2017     Lab Results  Component Value Date    ALBUMIN 3.8 01/13/2022   ALBUMIN 4.5 01/01/2022   ALBUMIN 4.3 03/29/2015    No results found for: MG No results found for: VD25OH  No results found for: PREALBUMIN    Latest Ref Rng & Units 12/18/2023    3:18 PM 06/30/2023   10:23 PM 01/13/2022    4:15 PM  CBC EXTENDED  WBC 4.0 - 10.5 K/uL 9.4  7.7  7.7   RBC 3.87 - 5.11 MIL/uL 3.93  4.17  4.05   Hemoglobin 12.0 - 15.0 g/dL 87.7  87.0  87.1   HCT 36.0 - 46.0 % 36.5  38.2  38.2   Platelets 150 - 400 K/uL 304  253  297   NEUT# 1.7 - 7.7 K/uL   5.5   Lymph# 0.7 - 4.0 K/uL   1.6      There is no height or weight on file to calculate BMI.  Orders:  No orders of the defined types were placed in this encounter.  Meds ordered this encounter  Medications   methocarbamol  (ROBAXIN ) 500 MG tablet    Sig: Take 1 tablet (500 mg total) by mouth 2 (two) times daily.    Dispense:  20 tablet    Refill:  0   traMADol  (  ULTRAM ) 50 MG tablet    Sig: Take 1 tablet (50 mg total) by mouth every 6 (six) hours as needed.    Dispense:  30 tablet    Refill:  0     Procedures: No procedures performed  Clinical Data: No additional findings.  ROS:  All other systems negative, except as noted in the HPI. Review of Systems  Objective: Vital Signs: There were no vitals taken for this visit.  Specialty Comments:  No specialty comments available.  PMFS History: Patient Active Problem List   Diagnosis Date Noted   Low back pain 01/05/2024   Visit for routine gyn exam 01/28/2022   Abnormal uterine bleeding (AUB) 01/28/2022   Insulin resistance 01/01/2022   Class 3 severe obesity due to excess calories with body mass index (BMI) of 40.0 to 44.9 in adult 01/01/2022   Secondary amenorrhea 12/27/2016   PCOS (polycystic ovarian syndrome) 12/27/2016   Past Medical History:  Diagnosis Date   ADHD (attention deficit hyperactivity disorder)    Asthma    Fibroid    Headache(784.0)    MRSA infection 2012   rt thigh   Polycystic  ovarian syndrome    Ulcer    Urinary tract infection     Family History  Problem Relation Age of Onset   Anesthesia problems Mother    Cancer Maternal Grandmother    Heart disease Maternal Grandfather    Diabetes Maternal Grandfather        leg amputee   Heart disease Paternal Uncle    Asthma Brother    Asthma Maternal Uncle    Diabetes Maternal Uncle    Hearing loss Maternal Uncle    Heart disease Maternal Uncle    Hypertension Maternal Uncle     Past Surgical History:  Procedure Laterality Date   MYRINGOTOMY     WISDOM TOOTH EXTRACTION     Social History   Occupational History   Not on file  Tobacco Use   Smoking status: Some Days    Types: Cigarettes   Smokeless tobacco: Never   Tobacco comments:    hooka  Vaping Use   Vaping status: Never Used  Substance and Sexual Activity   Alcohol use: No    Comment: rare   Drug use: No   Sexual activity: Yes    Birth control/protection: None

## 2024-01-24 ENCOUNTER — Ambulatory Visit: Attending: Physician Assistant

## 2024-01-24 NOTE — Therapy (Incomplete)
 OUTPATIENT PHYSICAL THERAPY THORACOLUMBAR EVALUATION   Patient Name: Monica Ramos MRN: [id number] DOB:Jun 17, 1990, 33 y.o., female Today's Date: 01/24/2024  END OF SESSION:   Past Medical History:  Diagnosis Date   ADHD (attention deficit hyperactivity disorder)    Asthma    Fibroid    Headache(784.0)    MRSA infection 2012   rt thigh   Polycystic ovarian syndrome    Ulcer    Urinary tract infection    Past Surgical History:  Procedure Laterality Date   MYRINGOTOMY     WISDOM TOOTH EXTRACTION     Patient Active Problem List   Diagnosis Date Noted   Low back pain 01/05/2024   Visit for routine gyn exam 01/28/2022   Abnormal uterine bleeding (AUB) 01/28/2022   Insulin resistance 01/01/2022   Class 3 severe obesity due to excess calories with body mass index (BMI) of 40.0 to 44.9 in adult 01/01/2022   Secondary amenorrhea 12/27/2016   PCOS (polycystic ovarian syndrome) 12/27/2016    PCP: Ronal Dragon Persons PA  REFERRING PROVIDER: Ronal Dragon Persons PA  REFERRING DIAG: Low back pain with left sided radicular symptoms  Rationale for Evaluation and Treatment: Rehabilitation  THERAPY DIAG:  No diagnosis found.  ONSET DATE: ***  SUBJECTIVE:                                                                                                                                                                                           SUBJECTIVE STATEMENT: ***  PERTINENT HISTORY:  PCOS, Obesity, Abnormal uterine bleeding  PAIN:  Are you having pain? Yes: NPRS scale: *** Pain location: *** Pain description: *** Aggravating factors: *** Relieving factors: ***  PRECAUTIONS: None  RED FLAGS: {PT Red Flags:29287}   WEIGHT BEARING RESTRICTIONS: No  FALLS:  Has patient fallen in last 6 months? {fallsyesno:27318}  LIVING ENVIRONMENT: Lives with: {OPRC lives with:25569::lives with their family} Lives in: {Lives in:25570} Stairs: {opstairs:27293} Has following  equipment at home: {Assistive devices:23999}  OCCUPATION: ***  PLOF: {PLOF:24004}  PATIENT GOALS: ***  NEXT MD VISIT: ***  OBJECTIVE:  Note: Objective measures were completed at Evaluation unless otherwise noted.  DIAGNOSTIC FINDINGS:  ***  PATIENT SURVEYS:  {rehab surveys:24030}  COGNITION: Overall cognitive status: {cognition:24006}     SENSATION: {sensation:27233}  MUSCLE LENGTH: Hamstrings: Right *** deg; Left *** deg [name]: Right *** deg; Left *** deg  POSTURE: {posture:25561}  PALPATION: ***  LUMBAR ROM:   AROM eval  Flexion   Extension   Right lateral flexion   Left lateral flexion   Right rotation   Left rotation    (Blank rows = not tested)  LOWER EXTREMITY ROM:     {AROM/PROM:27142}  Right eval Left eval  Hip flexion    Hip extension    Hip abduction    Hip adduction    Hip internal rotation    Hip external rotation    Knee flexion    Knee extension    Ankle dorsiflexion    Ankle plantarflexion    Ankle inversion    Ankle eversion     (Blank rows = not tested)  LOWER EXTREMITY MMT:    MMT Right eval Left eval  Hip flexion    Hip extension    Hip abduction    Hip adduction    Hip internal rotation    Hip external rotation    Knee flexion    Knee extension    Ankle dorsiflexion    Ankle plantarflexion    Ankle inversion    Ankle eversion     (Blank rows = not tested)  LUMBAR SPECIAL TESTS:  {lumbar special test:25242}  FUNCTIONAL TESTS:  {Functional tests:24029}  GAIT: Distance walked: *** Assistive device utilized: {Assistive devices:23999} Level of assistance: {Levels of assistance:24026} Comments: ***  TREATMENT DATE: ***                                                                                                                                 PATIENT EDUCATION:  Education details: *** Person educated: {Person educated:25204} Education method: {Education Method:25205} Education comprehension:  {Education Comprehension:25206}  HOME EXERCISE PROGRAM: ***  ASSESSMENT:  CLINICAL IMPRESSION: Patient is a *** y.o. *** who was seen today for physical therapy evaluation and treatment for ***.   OBJECTIVE IMPAIRMENTS: {opptimpairments:25111}.   ACTIVITY LIMITATIONS: {activitylimitations:27494}  PARTICIPATION LIMITATIONS: {participationrestrictions:25113}  PERSONAL FACTORS: {Personal factors:25162} are also affecting patient's functional outcome.   REHAB POTENTIAL: {rehabpotential:25112}  CLINICAL DECISION MAKING: {clinical decision making:25114}  EVALUATION COMPLEXITY: {Evaluation complexity:25115}   GOALS: Goals reviewed with patient? {yes/no:20286}  SHORT TERM GOALS: Target date: ***  *** Baseline: Goal status: INITIAL  2.  *** Baseline:  Goal status: INITIAL  3.  *** Baseline:  Goal status: INITIAL  4.  *** Baseline:  Goal status: INITIAL  5.  *** Baseline:  Goal status: INITIAL  6.  *** Baseline:  Goal status: INITIAL  LONG TERM GOALS: Target date: ***  *** Baseline:  Goal status: INITIAL  2.  *** Baseline:  Goal status: INITIAL  3.  *** Baseline:  Goal status: INITIAL  4.  *** Baseline:  Goal status: INITIAL  5.  *** Baseline:  Goal status: INITIAL  6.  *** Baseline:  Goal status: INITIAL  PLAN:  PT FREQUENCY: {rehab frequency:25116}  PT DURATION: {rehab duration:25117}  PLANNED INTERVENTIONS: {rehab planned interventions:25118::97110-Therapeutic exercises,97530- Therapeutic [phone]- Neuromuscular re-education,97535- Self Rjmz,02859- Manual therapy,Patient/Family education}.  PLAN FOR NEXT SESSION: ***   Alfonse Nadine PARAS Sabrinia Prien, PT, DTaP  01/24/2024, 8:04 AM

## 2024-02-04 ENCOUNTER — Ambulatory Visit: Admitting: Obstetrics and Gynecology

## 2024-03-01 ENCOUNTER — Encounter: Payer: Self-pay | Admitting: Radiology

## 2024-04-27 ENCOUNTER — Ambulatory Visit: Admitting: Physician Assistant

## 2024-05-17 ENCOUNTER — Ambulatory Visit: Admitting: Obstetrics and Gynecology

## 2024-05-18 ENCOUNTER — Ambulatory Visit: Admitting: Physician Assistant

## 2024-05-18 ENCOUNTER — Other Ambulatory Visit: Payer: Self-pay | Admitting: Physician Assistant

## 2024-05-18 ENCOUNTER — Encounter: Payer: Self-pay | Admitting: Physician Assistant

## 2024-05-18 ENCOUNTER — Telehealth: Payer: Self-pay | Admitting: Physician Assistant

## 2024-05-18 DIAGNOSIS — G8929 Other chronic pain: Secondary | ICD-10-CM

## 2024-05-18 DIAGNOSIS — M5441 Lumbago with sciatica, right side: Secondary | ICD-10-CM

## 2024-05-18 MED ORDER — METHOCARBAMOL 500 MG PO TABS: 500.0000 mg | ORAL_TABLET | Freq: Two times a day (BID) | ORAL | 0 refills | Status: AC

## 2024-05-18 MED ORDER — METHYLPREDNISOLONE 4 MG PO TBPK: ORAL_TABLET | ORAL | 0 refills | Status: AC

## 2024-05-18 NOTE — Telephone Encounter (Signed)
 Pt called asking for refill of methocarbamol . Please send to [facility] [address] Pt number is [phone].

## 2024-05-18 NOTE — Progress Notes (Signed)
 "  Office Visit Note   Patient: Monica Ramos           Date of Birth: 28-Mar-1991           MRN: [id number] Visit Date: 05/18/2024              Requested by: No referring provider defined for this encounter. PCP: Pcp, No  No chief complaint on file.     HPI: Patient is a pleasant 34 year old woman has a history of low back pain.  This was treated quite effectively last summer with a short course of steroids and Robaxin .  She has had no new injury but recently has a return of her symptoms.  She was post to do physical therapy but was unable to do it to go to some circumstances that she had going on  Assessment & Plan: Visit Diagnoses:  1. Chronic bilateral low back pain with right-sided sciatica     Plan: The time confirm that she is not pregnant would like to go forward with another set of steroid as well as Robaxin .  She is willing to go back to physical therapy obviously if this does not help her I would recommend an MRI  Follow-Up Instructions: No follow-ups on file.   Ortho Exam  Patient is alert, oriented, no adenopathy, well-dressed, normal affect, normal respiratory effort. Examination she has pain radiating down her left buttock.  She has a positive straight leg raise on the left she has good dorsiflexion plantarflexion of her ankle extension flexion of the legs no pain with manipulation of her hip no crepitus palpation on her lower    Imaging: No results found. No images are attached to the encounter.  Labs: Lab Results  Component Value Date   REPTSTATUS 04/16/2017 FINAL 04/13/2017   CULT >=100,000 COLONIES/mL ESCHERICHIA COLI (A) 04/13/2017   LABORGA ESCHERICHIA COLI (A) 04/13/2017     Lab Results  Component Value Date   ALBUMIN 3.8 01/13/2022   ALBUMIN 4.5 01/01/2022   ALBUMIN 4.3 03/29/2015    No results found for: MG No results found for: VD25OH  No results found for: PREALBUMIN    Latest Ref Rng & Units 12/18/2023    3:18 PM 06/30/2023    10:23 PM 01/13/2022    4:15 PM  CBC EXTENDED  WBC 4.0 - 10.5 K/uL 9.4  7.7  7.7   RBC 3.87 - 5.11 MIL/uL 3.93  4.17  4.05   Hemoglobin 12.0 - 15.0 g/dL 87.7  87.0  87.1   HCT 36.0 - 46.0 % 36.5  38.2  38.2   Platelets 150 - 400 K/uL 304  253  297   NEUT# 1.7 - 7.7 K/uL   5.5   Lymph# 0.7 - 4.0 K/uL   1.6      There is no height or weight on file to calculate BMI.  Orders:  No orders of the defined types were placed in this encounter.  No orders of the defined types were placed in this encounter.    Procedures: No procedures performed  Clinical Data: No additional findings.  ROS:  All other systems negative, except as noted in the HPI. Review of Systems  Objective: Vital Signs: There were no vitals taken for this visit.  Specialty Comments:  No specialty comments available.  PMFS History: Patient Active Problem List   Diagnosis Date Noted   Low back pain 01/05/2024   Visit for routine gyn exam 01/28/2022   Abnormal uterine bleeding (AUB) 01/28/2022  Insulin resistance 01/01/2022   Class 3 severe obesity due to excess calories with body mass index (BMI) of 40.0 to 44.9 in adult (HCC) 01/01/2022   Secondary amenorrhea 12/27/2016   PCOS (polycystic ovarian syndrome) 12/27/2016   Past Medical History:  Diagnosis Date   ADHD (attention deficit hyperactivity disorder)    Asthma    Fibroid    Headache(784.0)    MRSA infection 2012   rt thigh   Polycystic ovarian syndrome    Ulcer    Urinary tract infection     Family History  Problem Relation Age of Onset   Anesthesia problems Mother    Cancer Maternal Grandmother    Heart disease Maternal Grandfather    Diabetes Maternal Grandfather        leg amputee   Heart disease Paternal Uncle    Asthma Brother    Asthma Maternal Uncle    Diabetes Maternal Uncle    Hearing loss Maternal Uncle    Heart disease Maternal Uncle    Hypertension Maternal Uncle     Past Surgical History:  Procedure Laterality Date    MYRINGOTOMY     WISDOM TOOTH EXTRACTION     Social History   Occupational History   Not on file  Tobacco Use   Smoking status: Some Days    Types: Cigarettes   Smokeless tobacco: Never   Tobacco comments:    hooka  Vaping Use   Vaping status: Never Used  Substance and Sexual Activity   Alcohol use: No    Comment: rare   Drug use: No   Sexual activity: Yes    Birth control/protection: None       "

## 2024-05-25 ENCOUNTER — Ambulatory Visit: Admitting: Physician Assistant

## 2024-05-26 ENCOUNTER — Ambulatory Visit: Admitting: Family Medicine

## 2024-06-01 ENCOUNTER — Ambulatory Visit

## 2024-06-04 ENCOUNTER — Other Ambulatory Visit: Payer: Self-pay

## 2024-06-04 ENCOUNTER — Ambulatory Visit: Admitting: Physical Therapy

## 2024-06-04 ENCOUNTER — Encounter: Payer: Self-pay | Admitting: Physical Therapy

## 2024-06-04 DIAGNOSIS — M6281 Muscle weakness (generalized): Secondary | ICD-10-CM

## 2024-06-04 DIAGNOSIS — M5459 Other low back pain: Secondary | ICD-10-CM

## 2024-06-04 DIAGNOSIS — R2689 Other abnormalities of gait and mobility: Secondary | ICD-10-CM

## 2024-06-04 NOTE — Therapy (Signed)
 " OUTPATIENT PHYSICAL THERAPY THORACOLUMBAR EVALUATION  Patient Name: Monica Ramos MRN: [id number] DOB:[birth year], 34 y.o., female Today's Date: 06/04/2024   PT End of Session - 06/04/24 0926     Visit Number 1    Date for Recertification  07/30/24    Authorization Type [employer]    PT Start Time 0802    PT Stop Time 0847    PT Time Calculation (min) 45 min          Past Medical History:  Diagnosis Date   ADHD (attention deficit hyperactivity disorder)    Asthma    Fibroid    Headache(784.0)    MRSA infection 2012   rt thigh   Polycystic ovarian syndrome    Ulcer    Urinary tract infection    Past Surgical History:  Procedure Laterality Date   MYRINGOTOMY     WISDOM TOOTH EXTRACTION     Patient Active Problem List   Diagnosis Date Noted   Low back pain 01/05/2024   Visit for routine gyn exam 01/28/2022   Abnormal uterine bleeding (AUB) 01/28/2022   Insulin resistance 01/01/2022   Class 3 severe obesity due to excess calories with body mass index (BMI) of 40.0 to 44.9 in adult (HCC) 01/01/2022   Secondary amenorrhea 12/27/2016   PCOS (polycystic ovarian syndrome) 12/27/2016    PCP: Pcp, No  REFERRING PROVIDER: Persons, Ronal Dragon, PA  THERAPY DIAG:  Other low back pain - Plan: PT plan of care cert/re-cert  Muscle weakness - Plan: PT plan of care cert/re-cert  Other abnormalities of gait and mobility - Plan: PT plan of care cert/re-cert  REFERRING DIAG: Chronic bilateral low back pain with right-sided sciatica [G89.29, M54.41]   Rationale for Evaluation and Treatment:  Rehabilitation  SUBJECTIVE:  PERTINENT PAST HISTORY:  none        PRECAUTIONS: None  WEIGHT BEARING RESTRICTIONS No  FALLS:  Has patient fallen in last 6 months? No, Number of falls: 0  MOI/History of condition:  Onset date: episodic for about a year  SUBJECTIVE STATEMENT  Pt is a 34 y.o. female who presents to clinic with chief complaint of low back pain pain with radicular  pain in the L LE.  Started about a year ago and was intermittent.  Prednisone  did help but she did not like the way this made her feel.  Prednisone  did eliminate the pain but it has since returned over the last several months.  She was having sxs to her foot, but now the pain is mostly limited to the L hip.   Pain:  Are you having pain? Yes Pain location: L sided low back pain  NPRS scale:  Best: 0/10, Worst: 9/10 Aggravating factors: standing for long periods, sitting for long periods Relieving factors: resting  Pain description: sharp, aching, and shooting  Occupation: [employer] Education [employer] Device: na  Hand Dominance: na  Patient Goals/Specific Activities: reduce pain   OBJECTIVE:   GENERAL OBSERVATION/GAIT: W/s away from L in sitting   SENSATION: Light touch: Appears intact  LUMBAR AROM  AROM AROM  (Eval)  Flexion Fingertips to mid shin, w/ concordant pain  Extension WNL  Right lateral flexion WNL  Left lateral flexion WNL  Right rotation WNL  Left rotation WNL    (Blank rows = not tested)   LE MMT:  MMT Right (Eval) Left (Eval)  Hip flexion (L2, L3) n 4  Knee extension (L3) n 3*  Knee flexion    Hip abduction  Hip extension    Hip external rotation    Hip internal rotation    Hip adduction    Ankle dorsiflexion (L4)    Ankle plantarflexion (S1)    Ankle inversion    Ankle eversion    Great Toe ext (L5)    Grossly     (Blank rows = not tested, score listed is out of 5 possible points.  N = WNL, D = diminished, C = clear for gross weakness with myotome testing, * = concordant pain with testing)  SPECIAL TESTS:  Straight leg raise: L (+), R (-) Slump: L (+), R (-)  LE ROM:  ROM Right (Eval) Left (Eval)  Hip flexion    Hip extension    Hip abduction    Hip adduction    Hip internal rotation    Hip external rotation    Knee flexion    Knee extension    Ankle dorsiflexion    Ankle plantarflexion    Ankle inversion    Ankle eversion       (Blank rows = not tested, N = WNL, * = concordant pain with testing)  Functional Tests  Eval                                                                PALPATION:   Minimal TTP lower lumbar spin  PATIENT SURVEYS:  ODI: 22/50  HOME EXERCISE PROGRAM: Access Code: [email or web address] URL: [email or web address] Date: 06/04/2024 Prepared by: Helene Gasmen  Exercises - Seated Slump Nerve Glide  - 3-5 x daily - 7 x weekly - 2 sets - 10 reps - Prone Press Up  - 2 x daily - 7 x weekly - 2 sets - 10 reps - Standing Lumbar Extension  - 2 x daily - 7 x weekly - 2 sets - 10 reps  Treatment priorities   Eval        Ext based movments and nerve slides                                         TODAY'S TREATMENT:  Therapeutic Exercise: Creating, reviewing, and completing HEP   PATIENT EDUCATION ([place]/[facility]):  POC, diagnosis, prognosis, HEP, and outcome measures.  Pt educated via explanation, demonstration, and handout (HEP).  Pt confirms understanding verbally.   ASSESSMENT:  CLINICAL IMPRESSION: Monica Ramos is a 34 y.o. female who presents to clinic with signs and sxs consistent with low back pain with radicular sxs.   Given pts age and sxs likely a disc pathology.  Partial positive response to ext based exercises and nerve slides.   Monica Ramos will benefit from skilled PT to address relevant deficits and improve comfort with daily tasks and work.   OBJECTIVE IMPAIRMENTS: Pain, lumbar ROM, LE strength, gait  ACTIVITY LIMITATIONS: standing, walking, sleeping, working, housework  PERSONAL FACTORS: See medical history and pertinent history   REHAB POTENTIAL: Good  CLINICAL DECISION MAKING: Evolving/moderate complexity  EVALUATION COMPLEXITY: Moderate   GOALS:   SHORT TERM GOALS: Target date: 07/02/2024   Monica Ramos will be >75% HEP compliant to improve carryover between sessions and facilitate independent management of condition  Evaluation: ongoing Goal  status: INITIAL  LONG TERM GOALS: Target date: 07/30/2024   Monica Ramos will self report >/= 50% decrease in pain from evaluation to improve function in daily tasks  Evaluation/Baseline: 9/10 max pain Goal status: INITIAL   2.  Monica Ramos will show a >/= 12 pt improvement in their ODI score (MCID is 12% or 6/50 pts) as a proxy for functional improvement   Evaluation/Baseline: 22 pts Goal status: INITIAL   3.  Monica Ramos will be able to work, not limited by pain  Evaluation/Baseline: limited Goal status: INITIAL   4.  Monica Ramos will demonstrate (-) slump or SLR on L  Evaluation/Baseline: (+) Goal status: INITIAL    PLAN: PT FREQUENCY: 1-2x/week  PT DURATION: 8 weeks  PLANNED INTERVENTIONS:  [id number]- PT Re-evaluation, 97110-Therapeutic exercises, 97530- Therapeutic activity, [id number]- Neuromuscular re-education, 97535- Self Care, [id number]- Manual therapy, [id number]- Gait training, [id number]- Aquatic Therapy, [phone]- Electrical stimulation (manual), [id number]- Vasopneumatic device, [id number]- Traction (mechanical), [id number]- Ionotophoresis 4mg /ml Dexamethasone , Taping, Dry Needling, Joint manipulation, and Spinal manipulation.   Bridgit Eynon E Alexandera Kuntzman PT 06/04/2024, 9:29 AM    "

## 2024-06-15 ENCOUNTER — Ambulatory Visit: Admitting: Physical Therapy

## 2024-06-16 ENCOUNTER — Ambulatory Visit: Admitting: Physical Therapy

## 2024-06-22 ENCOUNTER — Ambulatory Visit: Admitting: Physical Therapy

## 2024-06-23 ENCOUNTER — Ambulatory Visit: Admitting: Physical Therapy

## 2024-06-29 ENCOUNTER — Ambulatory Visit: Admitting: Physical Therapy

## 2024-06-30 ENCOUNTER — Ambulatory Visit: Admitting: Physical Therapy

## 2024-07-06 ENCOUNTER — Ambulatory Visit: Admitting: Physical Therapy

## 2024-07-07 ENCOUNTER — Ambulatory Visit: Admitting: Physical Therapy
